# Patient Record
Sex: Male | Born: 1955 | Race: Black or African American | Hispanic: No | Marital: Married | Smoking: Former smoker
Health system: Southern US, Community
[De-identification: ages and names within clinical notes are randomized; demographics above are authoritative.]

## PROBLEM LIST (undated history)

## (undated) DIAGNOSIS — M170 Bilateral primary osteoarthritis of knee: Secondary | ICD-10-CM

## (undated) DIAGNOSIS — Z8719 Personal history of other diseases of the digestive system: Secondary | ICD-10-CM

## (undated) DIAGNOSIS — I219 Acute myocardial infarction, unspecified: Secondary | ICD-10-CM

## (undated) DIAGNOSIS — K759 Inflammatory liver disease, unspecified: Secondary | ICD-10-CM

## (undated) DIAGNOSIS — K219 Gastro-esophageal reflux disease without esophagitis: Secondary | ICD-10-CM

## (undated) DIAGNOSIS — E7800 Pure hypercholesterolemia, unspecified: Secondary | ICD-10-CM

## (undated) DIAGNOSIS — L309 Dermatitis, unspecified: Secondary | ICD-10-CM

## (undated) DIAGNOSIS — K5790 Diverticulosis of intestine, part unspecified, without perforation or abscess without bleeding: Secondary | ICD-10-CM

## (undated) DIAGNOSIS — S060X9A Concussion with loss of consciousness of unspecified duration, initial encounter: Secondary | ICD-10-CM

## (undated) DIAGNOSIS — I25118 Atherosclerotic heart disease of native coronary artery with other forms of angina pectoris: Secondary | ICD-10-CM

## (undated) DIAGNOSIS — C801 Malignant (primary) neoplasm, unspecified: Secondary | ICD-10-CM

## (undated) DIAGNOSIS — N529 Male erectile dysfunction, unspecified: Secondary | ICD-10-CM

## (undated) DIAGNOSIS — N183 Chronic kidney disease, stage 3 (moderate): Secondary | ICD-10-CM

## (undated) DIAGNOSIS — Z9889 Other specified postprocedural states: Secondary | ICD-10-CM

## (undated) DIAGNOSIS — E559 Vitamin D deficiency, unspecified: Secondary | ICD-10-CM

## (undated) DIAGNOSIS — G44309 Post-traumatic headache, unspecified, not intractable: Secondary | ICD-10-CM

## (undated) DIAGNOSIS — S069X9A Unspecified intracranial injury with loss of consciousness of unspecified duration, initial encounter: Secondary | ICD-10-CM

## (undated) DIAGNOSIS — S060XAA Concussion with loss of consciousness status unknown, initial encounter: Secondary | ICD-10-CM

## (undated) DIAGNOSIS — I10 Essential (primary) hypertension: Secondary | ICD-10-CM

## (undated) DIAGNOSIS — R413 Other amnesia: Secondary | ICD-10-CM

## (undated) DIAGNOSIS — I721 Aneurysm of artery of upper extremity: Secondary | ICD-10-CM

## (undated) HISTORY — DX: Atherosclerotic heart disease of native coronary artery with other forms of angina pectoris: I25.118

## (undated) HISTORY — DX: Unspecified intracranial injury with loss of consciousness of unspecified duration, initial encounter: S06.9X9A

## (undated) HISTORY — DX: Bilateral primary osteoarthritis of knee: M17.0

## (undated) HISTORY — DX: Concussion with loss of consciousness status unknown, initial encounter: S06.0XAA

## (undated) HISTORY — DX: Acute myocardial infarction, unspecified: I21.9

## (undated) HISTORY — DX: Other specified postprocedural states: Z98.890

## (undated) HISTORY — DX: Chronic kidney disease, stage 3 (moderate): N18.3

## (undated) HISTORY — DX: Essential (primary) hypertension: I10

## (undated) HISTORY — PX: CARDIAC CATHETERIZATION: SHX172

## (undated) HISTORY — DX: Post-traumatic headache, unspecified, not intractable: G44.309

## (undated) HISTORY — PX: DIAGNOSTIC LAPAROSCOPY: SUR761

## (undated) HISTORY — DX: Concussion with loss of consciousness of unspecified duration, initial encounter: S06.0X9A

## (undated) HISTORY — DX: Gastro-esophageal reflux disease without esophagitis: K21.9

## (undated) HISTORY — DX: Male erectile dysfunction, unspecified: N52.9

## (undated) HISTORY — PX: COLONOSCOPY: SHX174

## (undated) HISTORY — DX: Dermatitis, unspecified: L30.9

## (undated) HISTORY — DX: Other amnesia: R41.3

## (undated) HISTORY — PX: HERNIA REPAIR: SHX51

## (undated) HISTORY — DX: Personal history of other diseases of the digestive system: Z87.19

## (undated) HISTORY — PX: REPLACEMENT UNICONDYLAR JOINT KNEE: SUR1227

---

## 1988-01-18 HISTORY — PX: UMBILICAL HERNIA REPAIR: SHX196

## 2008-11-04 ENCOUNTER — Ambulatory Visit: Payer: Self-pay | Admitting: Gastroenterology

## 2010-04-15 ENCOUNTER — Ambulatory Visit: Payer: Self-pay | Admitting: Cardiology

## 2010-06-10 ENCOUNTER — Ambulatory Visit: Payer: Self-pay | Admitting: Gastroenterology

## 2010-12-15 ENCOUNTER — Encounter: Payer: Self-pay | Admitting: Internal Medicine

## 2010-12-18 ENCOUNTER — Encounter: Payer: Self-pay | Admitting: Internal Medicine

## 2011-01-18 ENCOUNTER — Encounter: Payer: Self-pay | Admitting: Internal Medicine

## 2011-02-22 ENCOUNTER — Ambulatory Visit: Payer: Self-pay | Admitting: Orthopedic Surgery

## 2012-04-04 ENCOUNTER — Encounter: Payer: Self-pay | Admitting: Nurse Practitioner

## 2012-04-04 ENCOUNTER — Ambulatory Visit (INDEPENDENT_AMBULATORY_CARE_PROVIDER_SITE_OTHER): Payer: Managed Care, Other (non HMO) | Admitting: Nurse Practitioner

## 2012-04-04 VITALS — BP 131/79 | HR 77 | Ht 69.5 in | Wt 219.0 lb

## 2012-04-04 DIAGNOSIS — R519 Headache, unspecified: Secondary | ICD-10-CM | POA: Insufficient documentation

## 2012-04-04 DIAGNOSIS — R51 Headache: Secondary | ICD-10-CM

## 2012-04-04 NOTE — Progress Notes (Signed)
I have read the note, and I agree with the medical plans. Marlan Palau MD 04/04/2012 2:43 PM
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
## 2012-04-04 NOTE — Patient Instructions (Signed)
Pt to continue Trazodone for headache.  [facility] regarding [facility]. Your BMI is elevated.  Please bring med list every visit.  Healthy diet for weight loss.

## 2012-07-11 ENCOUNTER — Telehealth: Payer: Self-pay | Admitting: Neurology

## 2012-07-11 NOTE — Telephone Encounter (Signed)
Patient states he went from having 2 headcahes a week to 4-5 a week. Patient takes 800 MG ibuprofen otc with relief , patient is concerned because they are more frequent now. He was last seen on 04-04-2012 for headaches and was not placed on any medication at the time and denies taking  Trazodone for headache.  Please call patient  [phone].

## 2012-07-12 NOTE — Telephone Encounter (Signed)
When patient was seen in 04/04/12 per chart review, he has continued to gain weight and has an elevated BMI.  He told me he was to [facility]. He was having headaches on awakening that is consistent with OSA. The trazadone is for sleep, because better sleep results in better headache control.  Ibuprofen and similar drugs cause rebound headache and problems with the liver.

## 2012-07-12 NOTE — Telephone Encounter (Signed)
I spoke with patient and he is also getting headaches during the day at work, in addition to waking with headaches.  That is when he takes Ibuprofen.  He said he won't be able to get a sleep study with the [place] for while.  He would like to know what else he can do?

## 2012-07-13 MED ORDER — TOPIRAMATE 25 MG PO TABS: ORAL_TABLET | ORAL | Status: DC

## 2012-07-13 NOTE — Addendum Note (Signed)
Addended by: Beverely Low on: 07/13/2012 12:43 PM   Modules accepted: Orders
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
## 2012-12-06 ENCOUNTER — Other Ambulatory Visit: Payer: Self-pay | Admitting: Nurse Practitioner

## 2013-01-31 ENCOUNTER — Ambulatory Visit: Payer: Self-pay | Admitting: Unknown Physician Specialty

## 2013-06-28 DIAGNOSIS — E7800 Pure hypercholesterolemia, unspecified: Secondary | ICD-10-CM | POA: Insufficient documentation

## 2013-06-28 DIAGNOSIS — I10 Essential (primary) hypertension: Secondary | ICD-10-CM | POA: Insufficient documentation

## 2013-06-28 DIAGNOSIS — N529 Male erectile dysfunction, unspecified: Secondary | ICD-10-CM

## 2013-06-28 DIAGNOSIS — K219 Gastro-esophageal reflux disease without esophagitis: Secondary | ICD-10-CM

## 2013-06-28 HISTORY — DX: Male erectile dysfunction, unspecified: N52.9

## 2013-06-28 HISTORY — DX: Gastro-esophageal reflux disease without esophagitis: K21.9

## 2013-06-29 DIAGNOSIS — I25118 Atherosclerotic heart disease of native coronary artery with other forms of angina pectoris: Secondary | ICD-10-CM

## 2013-06-29 HISTORY — DX: Atherosclerotic heart disease of native coronary artery with other forms of angina pectoris: I25.118

## 2014-01-17 HISTORY — PX: KNEE CARTILAGE SURGERY: SHX688

## 2014-07-02 DIAGNOSIS — L309 Dermatitis, unspecified: Secondary | ICD-10-CM

## 2014-07-02 DIAGNOSIS — M170 Bilateral primary osteoarthritis of knee: Secondary | ICD-10-CM

## 2014-07-02 HISTORY — DX: Dermatitis, unspecified: L30.9

## 2014-07-02 HISTORY — DX: Bilateral primary osteoarthritis of knee: M17.0

## 2014-07-03 DIAGNOSIS — Z79899 Other long term (current) drug therapy: Secondary | ICD-10-CM | POA: Insufficient documentation

## 2014-08-14 ENCOUNTER — Ambulatory Visit (INDEPENDENT_AMBULATORY_CARE_PROVIDER_SITE_OTHER): Payer: Managed Care, Other (non HMO) | Admitting: Neurology

## 2014-08-14 ENCOUNTER — Encounter: Payer: Self-pay | Admitting: Neurology

## 2014-08-14 VITALS — BP 136/102 | HR 64 | Ht 69.5 in | Wt 228.2 lb

## 2014-08-14 DIAGNOSIS — R413 Other amnesia: Secondary | ICD-10-CM

## 2014-08-14 DIAGNOSIS — G441 Vascular headache, not elsewhere classified: Secondary | ICD-10-CM | POA: Diagnosis not present

## 2014-08-14 HISTORY — DX: Other amnesia: R41.3
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
## 2014-09-17 ENCOUNTER — Other Ambulatory Visit: Payer: Self-pay | Admitting: Unknown Physician Specialty

## 2014-09-17 ENCOUNTER — Other Ambulatory Visit: Payer: Self-pay

## 2014-09-17 DIAGNOSIS — Z139 Encounter for screening, unspecified: Secondary | ICD-10-CM

## 2014-09-17 DIAGNOSIS — M170 Bilateral primary osteoarthritis of knee: Secondary | ICD-10-CM

## 2014-09-24 ENCOUNTER — Ambulatory Visit: Admission: RE | Admit: 2014-09-24 | Payer: PRIVATE HEALTH INSURANCE | Source: Ambulatory Visit

## 2014-09-24 ENCOUNTER — Ambulatory Visit: Payer: PRIVATE HEALTH INSURANCE

## 2014-10-07 ENCOUNTER — Ambulatory Visit
Admission: RE | Admit: 2014-10-07 | Discharge: 2014-10-07 | Disposition: A | Discharge status: Home / Self Care | Payer: PRIVATE HEALTH INSURANCE | Source: Ambulatory Visit | Attending: Unknown Physician Specialty | Admitting: Unknown Physician Specialty

## 2014-10-07 ENCOUNTER — Ambulatory Visit: Payer: PRIVATE HEALTH INSURANCE

## 2014-10-07 DIAGNOSIS — Z01818 Encounter for other preprocedural examination: Secondary | ICD-10-CM | POA: Diagnosis present

## 2014-10-07 DIAGNOSIS — M170 Bilateral primary osteoarthritis of knee: Secondary | ICD-10-CM | POA: Diagnosis present

## 2014-10-07 DIAGNOSIS — Z139 Encounter for screening, unspecified: Secondary | ICD-10-CM

## 2014-10-30 DIAGNOSIS — Z96653 Presence of artificial knee joint, bilateral: Secondary | ICD-10-CM | POA: Insufficient documentation

## 2014-11-03 ENCOUNTER — Ambulatory Visit: Payer: PRIVATE HEALTH INSURANCE | Admitting: Psychology

## 2014-11-04 ENCOUNTER — Encounter: Payer: Self-pay | Admitting: Psychology

## 2014-11-25 ENCOUNTER — Ambulatory Visit: Payer: PRIVATE HEALTH INSURANCE | Attending: Psychology | Admitting: Psychology

## 2014-11-25 DIAGNOSIS — R413 Other amnesia: Secondary | ICD-10-CM | POA: Diagnosis not present
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
## 2014-12-04 ENCOUNTER — Ambulatory Visit (INDEPENDENT_AMBULATORY_CARE_PROVIDER_SITE_OTHER): Payer: PRIVATE HEALTH INSURANCE | Admitting: Psychology

## 2014-12-04 DIAGNOSIS — S069X5D Unspecified intracranial injury with loss of consciousness greater than 24 hours with return to pre-existing conscious level, subsequent encounter: Secondary | ICD-10-CM

## 2014-12-04 DIAGNOSIS — G3184 Mild cognitive impairment, so stated: Secondary | ICD-10-CM
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
## 2014-12-22 ENCOUNTER — Telehealth: Payer: Self-pay | Admitting: Neurology

## 2014-12-22 MED ORDER — SERTRALINE HCL 50 MG PO TABS: 50.0000 mg | ORAL_TABLET | Freq: Every day | ORAL | Status: DC
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
## 2014-12-24 NOTE — Telephone Encounter (Signed)
Appointment scheduled with Hoyle Sauer, NP 02/25/15.

## 2015-01-05 ENCOUNTER — Telehealth: Payer: Self-pay | Admitting: Neurology

## 2015-01-05 NOTE — Telephone Encounter (Signed)
LFt vm for Daniel Jackson to call back about video disposition at [phone] with Dr. Jannifer Franklin

## 2015-01-05 NOTE — Telephone Encounter (Signed)
We are trying to get the physician set up for the afternoon of January 9.

## 2015-01-05 NOTE — Telephone Encounter (Signed)
Cindy/Mark Gray's office returned Jackson call regarding video deposition with Dr. Jannifer Franklin.

## 2015-01-06 NOTE — Telephone Encounter (Signed)
I called Daniel Jackson. Video deposition scheduled for 01/26/15 at 4 PM. They are going to send payment of $350 prior to appointment and will bring a check incase the appointment runs over their estimated 1 hour.

## 2015-02-13 DIAGNOSIS — N183 Chronic kidney disease, stage 3 unspecified: Secondary | ICD-10-CM

## 2015-02-13 DIAGNOSIS — D631 Anemia in chronic kidney disease: Secondary | ICD-10-CM | POA: Insufficient documentation

## 2015-02-13 DIAGNOSIS — S069XAA Unspecified intracranial injury with loss of consciousness status unknown, initial encounter: Secondary | ICD-10-CM

## 2015-02-13 DIAGNOSIS — S069X9A Unspecified intracranial injury with loss of consciousness of unspecified duration, initial encounter: Secondary | ICD-10-CM

## 2015-02-13 HISTORY — DX: Chronic kidney disease, stage 3 unspecified: N18.30

## 2015-02-13 HISTORY — DX: Unspecified intracranial injury with loss of consciousness of unspecified duration, initial encounter: S06.9X9A

## 2015-02-13 HISTORY — DX: Unspecified intracranial injury with loss of consciousness status unknown, initial encounter: S06.9XAA

## 2015-02-25 ENCOUNTER — Telehealth: Payer: Self-pay | Admitting: Nurse Practitioner

## 2015-02-25 ENCOUNTER — Ambulatory Visit: Payer: Self-pay | Admitting: Nurse Practitioner

## 2015-02-25 ENCOUNTER — Ambulatory Visit (INDEPENDENT_AMBULATORY_CARE_PROVIDER_SITE_OTHER): Payer: PRIVATE HEALTH INSURANCE | Admitting: Nurse Practitioner

## 2015-02-25 ENCOUNTER — Encounter: Payer: Self-pay | Admitting: Nurse Practitioner

## 2015-02-25 VITALS — BP 140/80 | HR 58 | Wt 203.0 lb

## 2015-02-25 DIAGNOSIS — R413 Other amnesia: Secondary | ICD-10-CM

## 2015-02-25 DIAGNOSIS — G441 Vascular headache, not elsewhere classified: Secondary | ICD-10-CM | POA: Diagnosis not present

## 2015-02-25 NOTE — Telephone Encounter (Signed)
Pt told me pharmacy is [facility] [address] [place] the only [employer] listed in [place] is [facility] and [employer]. Please call back to clarify

## 2015-02-25 NOTE — Patient Instructions (Signed)
Continue Zoloft at current dose F/U in 8 months
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
## 2015-02-25 NOTE — Progress Notes (Signed)
I have read the note, and I agree with the clinical assessment and plan.  Erikson Danzy KEITH   

## 2015-02-26 NOTE — Telephone Encounter (Signed)
Called patient again and left a messsage I need to clarify the pharmacy for his refills.

## 2015-03-03 NOTE — Telephone Encounter (Signed)
Patient has been called again to clarify his pharmacy left  voice message. I will close the phone note.

## 2015-03-05 ENCOUNTER — Other Ambulatory Visit: Payer: Self-pay | Admitting: Nurse Practitioner

## 2015-03-05 MED ORDER — SERTRALINE HCL 50 MG PO TABS: 50.0000 mg | ORAL_TABLET | Freq: Every day | ORAL | Status: DC

## 2015-03-05 NOTE — Telephone Encounter (Signed)
Pt called said [facility] in [place] is [facility] [address]

## 2015-03-08 ENCOUNTER — Other Ambulatory Visit: Payer: Self-pay | Admitting: Neurology

## 2015-06-05 ENCOUNTER — Other Ambulatory Visit: Payer: Self-pay | Admitting: Neurology

## 2015-10-13 ENCOUNTER — Other Ambulatory Visit: Payer: Self-pay | Admitting: Neurology

## 2015-10-27 ENCOUNTER — Ambulatory Visit: Payer: PRIVATE HEALTH INSURANCE | Admitting: Nurse Practitioner

## 2016-01-02 DIAGNOSIS — Z8546 Personal history of malignant neoplasm of prostate: Secondary | ICD-10-CM | POA: Insufficient documentation

## 2016-01-02 DIAGNOSIS — R972 Elevated prostate specific antigen [PSA]: Secondary | ICD-10-CM | POA: Insufficient documentation

## 2016-01-05 DIAGNOSIS — Z96652 Presence of left artificial knee joint: Secondary | ICD-10-CM | POA: Insufficient documentation

## 2016-01-05 DIAGNOSIS — Z96651 Presence of right artificial knee joint: Secondary | ICD-10-CM | POA: Insufficient documentation

## 2016-02-08 ENCOUNTER — Ambulatory Visit: Payer: Self-pay

## 2016-02-08 ENCOUNTER — Other Ambulatory Visit: Payer: Self-pay | Admitting: Neurology

## 2016-02-09 ENCOUNTER — Ambulatory Visit: Payer: Self-pay

## 2016-03-01 ENCOUNTER — Ambulatory Visit (INDEPENDENT_AMBULATORY_CARE_PROVIDER_SITE_OTHER): Payer: PRIVATE HEALTH INSURANCE | Admitting: Urology

## 2016-03-01 ENCOUNTER — Encounter: Payer: Self-pay | Admitting: Urology

## 2016-03-01 VITALS — BP 120/79 | HR 72 | Ht 69.0 in | Wt 229.0 lb

## 2016-03-01 DIAGNOSIS — R972 Elevated prostate specific antigen [PSA]: Secondary | ICD-10-CM
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
## 2016-03-02 LAB — PSA TOTAL (REFLEX TO FREE): Prostate Specific Ag, Serum: 8.8 ng/mL — ABNORMAL HIGH (ref 0.0–4.0)

## 2016-03-02 LAB — FPSA% REFLEX
% FREE PSA: 14.1 %
PSA, FREE: 1.24 ng/mL

## 2016-03-04 ENCOUNTER — Telehealth: Payer: Self-pay

## 2016-03-04 NOTE — Telephone Encounter (Signed)
Daniel Aloe, MD  Lestine Box, LPN        Notify patient his PSA remains elevated -- I recommend we proceed with a prostate biopsy as discussed. Please schedule for a prostate biopsy.    LMOM

## 2016-03-07 ENCOUNTER — Telehealth: Payer: Self-pay

## 2016-03-07 NOTE — Telephone Encounter (Signed)
Patient was scheduled for a prostate biopsy with Angie and transferred to go over pre-instructions.  Spoke with patient and he states that his ASA 81mg  is only taken as preventative and not under cardiac direction. Patient was told to stop ASA 81mg  7 days prior to procedure, prostate biopsy instructions were given in detail and patient verbalized understanding. A copy of written instructions with apt date and time was also mailed to the patient/

## 2016-03-07 NOTE — Telephone Encounter (Signed)
Patient notified and will schedule Biopsy

## 2016-03-07 NOTE — Telephone Encounter (Signed)
LMOM

## 2016-03-22 ENCOUNTER — Encounter: Payer: Self-pay | Admitting: Urology

## 2016-03-22 ENCOUNTER — Other Ambulatory Visit: Payer: Self-pay | Admitting: Urology

## 2016-03-22 ENCOUNTER — Ambulatory Visit (INDEPENDENT_AMBULATORY_CARE_PROVIDER_SITE_OTHER): Payer: PRIVATE HEALTH INSURANCE | Admitting: Urology

## 2016-03-22 VITALS — BP 146/81 | HR 65 | Ht 69.0 in | Wt 233.6 lb

## 2016-03-22 DIAGNOSIS — R972 Elevated prostate specific antigen [PSA]: Secondary | ICD-10-CM | POA: Diagnosis not present

## 2016-03-22 MED ORDER — GENTAMICIN SULFATE 40 MG/ML IJ SOLN
80.0000 mg | Freq: Once | INTRAMUSCULAR | Status: AC
  Administered 2016-03-22: 80 mg via INTRAMUSCULAR

## 2016-03-22 MED ORDER — LEVOFLOXACIN 500 MG PO TABS
500.0000 mg | ORAL_TABLET | Freq: Once | ORAL | Status: AC
  Administered 2016-03-22: 500 mg via ORAL

## 2016-03-22 NOTE — Progress Notes (Signed)
Pt returns for prostate biopsy.   PSA of 9.07 on 01/01/2016. UA was negative. He has no prior PSA elevation or prostate biopsy. His Feb 2018 DRE c/w L>R asymmetry. Repeat PSA 8.8, 14% free.   No h/o BPH. He has occasional frequency and nocturia. He has no family history of prostate cancer.  He has a h/o ED.   Prostate Biopsy Procedure   Informed consent was obtained after discussing risks/benefits of the procedure.  A time out was performed to ensure correct patient identity.  Pre-Procedure: - Last PSA Level: No results found for: PSA - Gentamicin given prophylactically - Levaquin 500 mg administered PO -DRE: Subtle induration bilaterally - no obvious nodule -Transrectal Ultrasound performed revealing a 52.23 gram prostate -No median lobe noted -Hypoechoic area left mid about 10 mm   Procedure: - Prostate block performed using 10 cc 1% lidocaine and biopsies taken from sextant areas, a total of 12 under ultrasound guidance.  Post-Procedure: - Patient tolerated the procedure well - He was counseled to seek immediate medical attention if experiences any severe pain, significant bleeding, or fevers - Return in one week to discuss biopsy results

## 2016-03-29 ENCOUNTER — Other Ambulatory Visit: Payer: Self-pay | Admitting: Urology

## 2016-03-29 LAB — PATHOLOGY REPORT

## 2016-04-01 ENCOUNTER — Encounter: Payer: Self-pay | Admitting: Urology

## 2016-04-01 ENCOUNTER — Ambulatory Visit (INDEPENDENT_AMBULATORY_CARE_PROVIDER_SITE_OTHER): Payer: PRIVATE HEALTH INSURANCE | Admitting: Urology

## 2016-04-01 VITALS — BP 142/93 | HR 52 | Ht 69.0 in | Wt 229.4 lb

## 2016-04-01 DIAGNOSIS — C61 Malignant neoplasm of prostate: Secondary | ICD-10-CM | POA: Diagnosis not present

## 2016-04-01 DIAGNOSIS — N5201 Erectile dysfunction due to arterial insufficiency: Secondary | ICD-10-CM | POA: Diagnosis not present
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
## 2016-04-07 ENCOUNTER — Encounter (INDEPENDENT_AMBULATORY_CARE_PROVIDER_SITE_OTHER): Payer: Self-pay

## 2016-04-07 ENCOUNTER — Ambulatory Visit
Admission: RE | Admit: 2016-04-07 | Discharge: 2016-04-07 | Disposition: A | Discharge status: Home / Self Care | Payer: PRIVATE HEALTH INSURANCE | Source: Ambulatory Visit | Attending: Radiation Oncology | Admitting: Radiation Oncology

## 2016-04-07 ENCOUNTER — Encounter: Payer: Self-pay | Admitting: Radiation Oncology

## 2016-04-07 VITALS — BP 133/77 | HR 64 | Temp 97.1°F | Resp 18 | Wt 229.3 lb

## 2016-04-07 DIAGNOSIS — C61 Malignant neoplasm of prostate: Secondary | ICD-10-CM
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
## 2016-04-07 NOTE — Progress Notes (Signed)
  Oncology Nurse Navigator Documentation Met with Daniel Jackson, his spouse and mother in law during consult with Dr. Baruch Gouty. Introduced nurse navigator services and provided contact information for future needs. He will return to [facility] 4/19 to discuss surgery further. Navigator Location: [facility] (04/07/16 1400)   )Navigator Encounter Type: Initial RadOnc (04/07/16 1400)                     Patient Visit Type: RadOnc (04/07/16 1400) Treatment Phase: Pre-Tx/Tx Discussion (04/07/16 1400) Barriers/Navigation Needs: No barriers at this time (04/07/16 1400)                Acuity: Level 2 (04/07/16 1400)   Acuity Level 2: Initial guidance, education and coordination as needed;Educational needs;Ongoing guidance and education throughout treatment as needed (04/07/16 1400)     Time Spent with Patient: 30 (04/07/16 1400)

## 2016-05-05 ENCOUNTER — Ambulatory Visit (INDEPENDENT_AMBULATORY_CARE_PROVIDER_SITE_OTHER): Payer: PRIVATE HEALTH INSURANCE | Admitting: Urology

## 2016-05-05 ENCOUNTER — Encounter: Payer: Self-pay | Admitting: Urology

## 2016-05-05 VITALS — BP 126/91 | HR 67 | Ht 69.0 in | Wt 228.0 lb

## 2016-05-05 DIAGNOSIS — N529 Male erectile dysfunction, unspecified: Secondary | ICD-10-CM | POA: Diagnosis not present

## 2016-05-05 DIAGNOSIS — C61 Malignant neoplasm of prostate: Secondary | ICD-10-CM | POA: Diagnosis not present
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
## 2016-05-12 ENCOUNTER — Other Ambulatory Visit: Payer: Self-pay | Admitting: Radiology

## 2016-05-12 ENCOUNTER — Telehealth: Payer: Self-pay | Admitting: Radiology

## 2016-05-12 ENCOUNTER — Ambulatory Visit: Payer: PRIVATE HEALTH INSURANCE | Admitting: Radiation Oncology

## 2016-05-12 DIAGNOSIS — C61 Malignant neoplasm of prostate: Secondary | ICD-10-CM

## 2016-05-12 NOTE — Telephone Encounter (Signed)
Notified pt's wife of surgery scheduled with Dr [name] on 07/01/16, pre-admit testing appt & to call day prior to surgery for arrival time to SDS. Advised that pt will be notified when to hold ASA 81mg  once clearance is obtained from Dr Josefa Half. Wife had no questions & voices understanding.

## 2016-05-13 NOTE — Telephone Encounter (Signed)
Advised pt per clearance obtained from Dr Saralyn Pilar to hold ASA 81mg  x 7 days prior to surgery, beginning on 06/24/16. Pt voices understanding.

## 2016-05-29 ENCOUNTER — Other Ambulatory Visit: Payer: Self-pay | Admitting: Neurology

## 2016-05-31 ENCOUNTER — Ambulatory Visit: Payer: PRIVATE HEALTH INSURANCE

## 2016-06-02 ENCOUNTER — Encounter: Payer: Self-pay | Admitting: Neurology

## 2016-06-02 ENCOUNTER — Ambulatory Visit (INDEPENDENT_AMBULATORY_CARE_PROVIDER_SITE_OTHER): Payer: 59 | Admitting: Neurology

## 2016-06-02 VITALS — BP 139/93 | HR 57 | Ht 69.0 in | Wt 232.0 lb

## 2016-06-02 DIAGNOSIS — S069X1S Unspecified intracranial injury with loss of consciousness of 30 minutes or less, sequela: Secondary | ICD-10-CM | POA: Diagnosis not present

## 2016-06-02 MED ORDER — SERTRALINE HCL 100 MG PO TABS: 100.0000 mg | ORAL_TABLET | Freq: Every day | ORAL | 11 refills | Status: DC

## 2016-06-02 NOTE — Patient Instructions (Signed)
   We will be increasing the zoloft to 100 mg daily.
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
## 2016-06-04 ENCOUNTER — Other Ambulatory Visit: Payer: Self-pay | Admitting: Neurology

## 2016-06-15 ENCOUNTER — Ambulatory Visit: Payer: PRIVATE HEALTH INSURANCE | Admitting: Physical Therapy

## 2016-06-16 IMAGING — CT CT KNEE*R* W/O CM
2 of 16 series · 4 of 33 positions shown, 5 images · non-contrast
Comparison: None.

CLINICAL DATA: Severe osteoarthritis of the knee. Preoperative
planning for MAKOplasty.

EXAM:
CT OF THE right KNEE WITHOUT CONTRAST
TECHNIQUE: Multidetector CT imaging of the right knee was performed according
to the VELTRAN protocol. Multiplanar CT image reconstructions were also
generated. Axial images were also obtained through the right hip and
right ankle for preoperative planning.

[Series 6: axial bone right · axial · 0.44mm/px · z∈[+244,+572]mm · 2 of 165 slices shown, 3 images]
[im 1/165  soft-tissue]
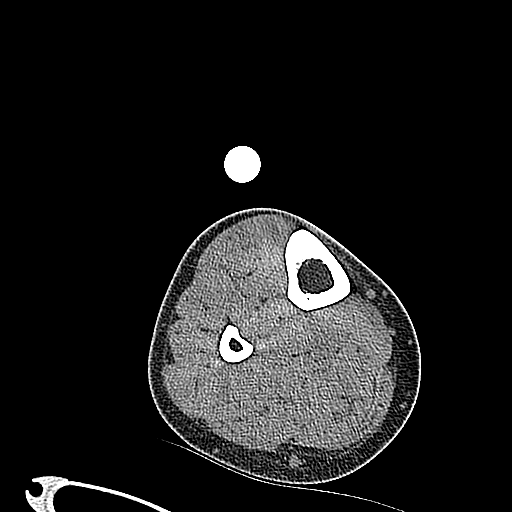
[im 1/165  bone]
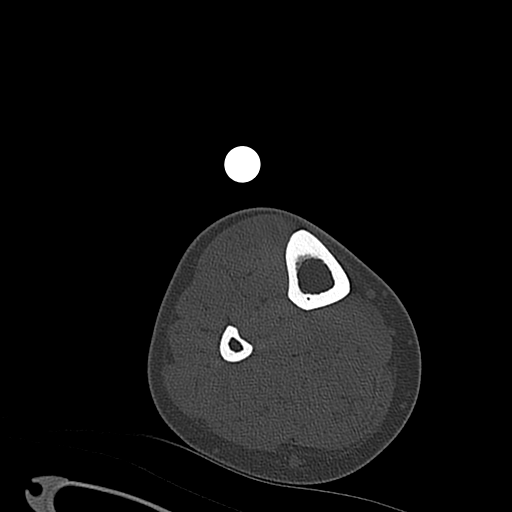
[im 165/165  bone]
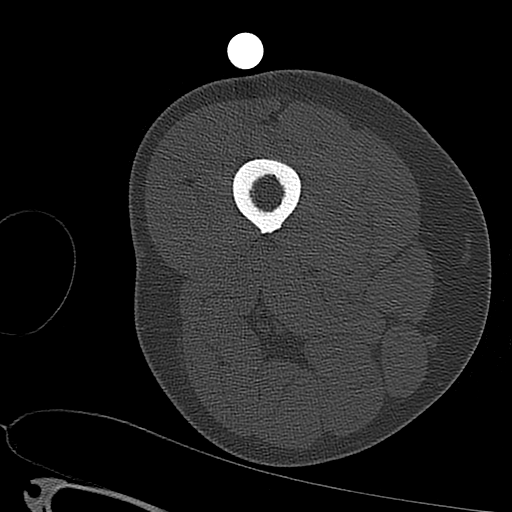

[Series 603: sagittal bone right · sagittal · 0.64mm/px · 2 of 59 slices shown]
[im 20/59  bone]
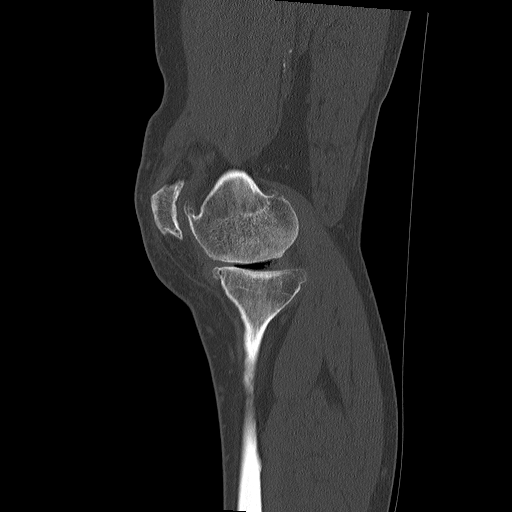
[im 39/59  bone]
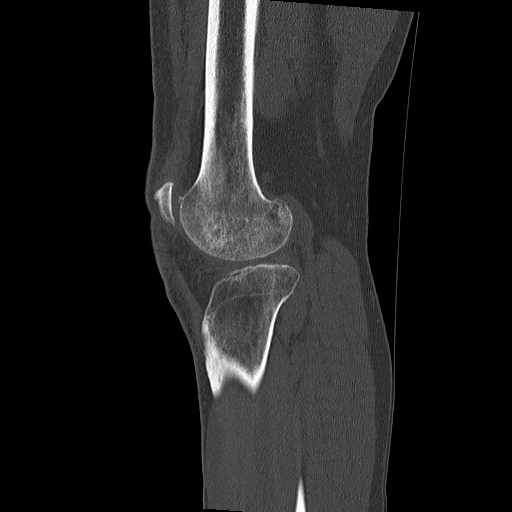

[4 of 33 positions shown; findings below may reference images not displayed]

FINDINGS: There is severe osteoarthritis of the medial compartment and of the
patellofemoral compartment. Slight degenerative changes of the
lateral compartment. Prominent tricompartmental marginal
osteophytes. Vacuum phenomenon in the medial compartment.

Minimal subcortical cysts in the acetabulum of the right hip.
IMPRESSION: Severe osteoarthritis of the patellofemoral and medial compartments
of the right knee.

## 2016-06-17 MED ORDER — CEFAZOLIN SODIUM-DEXTROSE 2-4 GM/100ML-% IV SOLN
INTRAVENOUS | Status: AC
  Filled 2016-06-17: qty 100

## 2016-06-17 MED ORDER — FAMOTIDINE 20 MG PO TABS
ORAL_TABLET | ORAL | Status: AC
  Filled 2016-06-17: qty 1

## 2016-06-21 ENCOUNTER — Inpatient Hospital Stay: Admission: RE | Admit: 2016-06-21 | Payer: PRIVATE HEALTH INSURANCE | Source: Ambulatory Visit

## 2016-06-22 ENCOUNTER — Other Ambulatory Visit: Payer: PRIVATE HEALTH INSURANCE

## 2016-06-23 ENCOUNTER — Ambulatory Visit: Payer: 59 | Attending: Urology | Admitting: Physical Therapy

## 2016-06-23 ENCOUNTER — Other Ambulatory Visit: Payer: PRIVATE HEALTH INSURANCE

## 2016-06-23 ENCOUNTER — Encounter
Admission: RE | Admit: 2016-06-23 | Discharge: 2016-06-23 | Disposition: A | Discharge status: Home / Self Care | Payer: 59 | Source: Ambulatory Visit | Attending: Urology | Admitting: Urology

## 2016-06-23 ENCOUNTER — Telehealth: Payer: Self-pay | Admitting: Radiology

## 2016-06-23 DIAGNOSIS — Z01812 Encounter for preprocedural laboratory examination: Secondary | ICD-10-CM | POA: Insufficient documentation

## 2016-06-23 DIAGNOSIS — I10 Essential (primary) hypertension: Secondary | ICD-10-CM | POA: Insufficient documentation

## 2016-06-23 DIAGNOSIS — Z01810 Encounter for preprocedural cardiovascular examination: Secondary | ICD-10-CM | POA: Diagnosis present

## 2016-06-23 DIAGNOSIS — R278 Other lack of coordination: Secondary | ICD-10-CM | POA: Diagnosis present

## 2016-06-23 DIAGNOSIS — R9431 Abnormal electrocardiogram [ECG] [EKG]: Secondary | ICD-10-CM | POA: Insufficient documentation

## 2016-06-23 DIAGNOSIS — M6281 Muscle weakness (generalized): Secondary | ICD-10-CM | POA: Diagnosis present

## 2016-06-23 HISTORY — DX: Inflammatory liver disease, unspecified: K75.9

## 2016-06-23 LAB — CBC
HEMATOCRIT: 38.8 % — AB (ref 40.0–52.0)
Hemoglobin: 13.1 g/dL (ref 13.0–18.0)
MCH: 29.8 pg (ref 26.0–34.0)
MCHC: 33.9 g/dL (ref 32.0–36.0)
MCV: 88.1 fL (ref 80.0–100.0)
PLATELETS: 226 10*3/uL (ref 150–440)
RBC: 4.4 MIL/uL (ref 4.40–5.90)
RDW: 13.4 % (ref 11.5–14.5)
WBC: 6.4 10*3/uL (ref 3.8–10.6)

## 2016-06-23 LAB — BASIC METABOLIC PANEL
Anion gap: 7 (ref 5–15)
BUN: 27 mg/dL — ABNORMAL HIGH (ref 6–20)
CHLORIDE: 107 mmol/L (ref 101–111)
CO2: 23 mmol/L (ref 22–32)
CREATININE: 1.76 mg/dL — AB (ref 0.61–1.24)
Calcium: 8.9 mg/dL (ref 8.9–10.3)
GFR calc non Af Amer: 40 mL/min — ABNORMAL LOW (ref >60)
GFR, EST AFRICAN AMERICAN: 46 mL/min — AB (ref >60)
Glucose, Bld: 140 mg/dL — ABNORMAL HIGH (ref 65–99)
POTASSIUM: 3.7 mmol/L (ref 3.5–5.1)
Sodium: 137 mmol/L (ref 135–145)

## 2016-06-23 LAB — APTT: aPTT: 27 seconds (ref 24–36)

## 2016-06-23 LAB — PROTIME-INR
INR: 0.98
Prothrombin Time: 13 seconds (ref 11.4–15.2)
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
## 2016-06-23 NOTE — Pre-Procedure Instructions (Signed)
Cardiac clearance by Dr Saralyn Pilar on chart.

## 2016-06-23 NOTE — Pre-Procedure Instructions (Signed)
Dr Raechel Ache' office notified regarding patient's feelings of depression and hopelessness. Also informed that patient was attempting to get assistance through the [place].

## 2016-06-23 NOTE — Telephone Encounter (Signed)
-----   Message from Florentina Addison, RN sent at 06/23/2016  3:22 PM EDT ----- Regarding: Feelings of hopelessness  Patient expressed feelings of hopelessness.  Flat affect.  Has had feeling to self harm but not at present.  Is trying to get an appointment to get help with feeling of depression through the [place].

## 2016-06-23 NOTE — Patient Instructions (Addendum)
Your procedure is scheduled on: 07/01/16 Fri Report to Same Day Surgery 2nd floor medical mall [facility] Entrance-take elevator on left to 2nd floor.  Check in with surgery information desk.) To find out your arrival time please call [phone] between 1PM - 3PM on 06/30/16 Thurs  Remember: Instructions that are not followed completely may result in serious medical risk, up to and including death, or upon the discretion of your surgeon and anesthesiologist your surgery may need to be rescheduled.    _x___ 1. Do not eat food or drink liquids after midnight. No gum chewing or                              hard candies.     __x__ 2. No Alcohol for 24 hours before or after surgery.   __x__3. No Smoking for 24 prior to surgery.   ____  4. Bring all medications with you on the day of surgery if instructed.    __x__ 5. Notify your doctor if there is any change in your medical condition     (cold, fever, infections).     Do not wear jewelry, make-up, hairpins, clips or nail polish.  Do not wear lotions, powders, or perfumes. You may wear deodorant.  Do not shave 48 hours prior to surgery. Men may shave face and neck.  Do not bring valuables to the hospital.    [facility] is not responsible for any belongings or valuables.               Contacts, dentures or bridgework may not be worn into surgery.  Leave your suitcase in the car. After surgery it may be brought to your room.  For patients admitted to the hospital, discharge time is determined by your                       treatment team.   Patients discharged the day of surgery will not be allowed to drive home.  You will need someone to drive you home and stay with you the night of your procedure.    Please read over the following fact sheets that you were given:   [facility] Preparing for Surgery and or MRSA Information   _x___ Take anti-hypertensive (unless it includes a diuretic), cardiac, seizure, asthma,     anti-reflux and  psychiatric medicines. These include:  1. atorvastatin (LIPITOR)   2.ezetimibe (ZETIA  3.omeprazole (PRILOSEC OTC)  4. sertraline (ZOLOFT)   5.  6.  ____Fleets enema or Magnesium Citrate as directed.   _x___ Use CHG Soap or sage wipes as directed on instruction sheet   ____ Use inhalers on the day of surgery and bring to hospital day of surgery  ____ Stop Metformin and Janumet 2 days prior to surgery.    ____ Take 1/2 of usual insulin dose the night before surgery and none on the morning     surgery.   _x___ Follow recommendations from Cardiologist, Pulmonologist or PCP regarding          stopping Aspirin, Coumadin, Pllavix ,Eliquis, Effient, or Pradaxa, and Pletal.                   To stop Aspirin on Saturday.  X____Stop Anti-inflammatories such as Advil, Aleve, Ibuprofen, Motrin, Naproxen, Naprosyn, Goodies powders or aspirin products. OK to take Tylenol and  Celebrex.   _x___ Stop supplements until after surgery.  But may continue Vitamin D, Vitamin B,       and multivitamin.   ____ Bring C-Pap to the hospital.

## 2016-06-23 NOTE — Patient Instructions (Addendum)
Preserve the function of your pelvic floor, abdomen, and back.              Avoid decreased straining of abdominal/pelvic floor muscles with less  slouching,  holding your breath with lifting/bowel movements)  Stay mindful of your posture throughout the day. Keep your deep core muscles active.  . When sitting: Feet under knees, bear weight through shin bones and pelvic sitting bones  . Alternate between sitting and standing w/ computer work. Screen of computer needs to be at eye level. Desk height needs to be at a level where your shoulders/elbows should be relaxed and not raised.  . When sitting on toilet: prop your feet up on a stepstool. Breathe for bowel movements and do not push/bear down with belly or pelvic floor muscles.  . When lifting: Do not bend forward with your back. Instead stand with feet wide and use squat position to lift with the strength of your legs. Exhale as you lift and keep object close to your center as you move it.  . When getting out of bed, do not do a sit-up. Raise arm towards ear (same side as the edge of bed you will get out of), roll towards that side to be in full sideyling position. Then drop legs off edge of bed while pushing up with        bottom arm.   PELVIC FLOOR / KEGEL EXERCISES   Pelvic floor/ Kegel exercises are used to strengthen the muscles in the base of your pelvis that are responsible for supporting your pelvic organs and preventing urine/feces leakage. Based on your therapist's recommendations, they can be performed while standing, sitting, or lying down.  Make yourself aware of this muscle group by using these cues:  Imagine you are in a crowded room and you feel the need to pass gas. Your response is to pull up and in at the rectum.  Close the rectum. Pull the muscles up inside your body,feeling your scrotum lifting as well . Feel the pelvic floor muscles lift as if you were walking into a cold  lake.  Place your hand on top of your pubic bone. Tighten and draw in the muscles around the anal muscles without squeezing the buttock   Breath holding: If you are holding your breath, you may be bearing down against your bladder instead of pulling it up. If you belly bulges up while you are squeezing, you are holding your breath. Be sure to breathe gently in and out while exercising. Counting out loud may help you avoid holding your breath.  Accessory muscle use: You should not see or feel other muscle movement when performing pelvic floor exercises. When done properly, no one can tell that you are performing the exercises. Keep the buttocks, belly and inner thighs relaxed.  Overdoing it: Your muscles can fatigue and stop working for you if you over-exercise. You may actually leak more or feel soreness at the lower abdomen or rectum.  YOUR HOME EXERCISE PROGRAM   SHORT HOLDS: Position: on back or sitting    Inhale and then exhale. Then squeeze the muscle.  (Be sure to let belly sink in with exhales and not push outward)  Perform 5 repetitions, 5  Times/day  **ALSO SQUEEZE BEFORE YOUR SNEEZE, COUGH, LAUGH to decrease downward pressure   **ALSO EXHALE BEFORE YOU RISE AGAINST GRAVITY (lifting, sit to stand, from squat to stand)    **  .perform before surgery and only after catheter has been removed after surgery   ________________           -   _____________  Stretch out your spine in all 6 directions   Side bend 5 reps  Arm swings  By a table/counter: folded forward and lengthening spine    _____________

## 2016-06-23 NOTE — Pre-Procedure Instructions (Addendum)
Depressed had thoughts of harming self.  Is making an appointment through the [facility] for psychological assistance.  Call Amy at [place].

## 2016-06-23 NOTE — Telephone Encounter (Signed)
Reached out to pt regarding feelings of hopelessness. Pt states he is actually feeling ok. When asked if he wants to postpone surgery he stated "No, we are going to do this.  The sooner the better." He plans to make an appt tomorrow through the [place] with a psychiatrist. Encouraged pt to call back with any concerns or if he just needs someone to talk to about the surgery. Pt voices appreciation & understanding.

## 2016-06-25 LAB — URINE CULTURE: Culture: NO GROWTH

## 2016-07-01 ENCOUNTER — Encounter: Payer: Self-pay | Admitting: *Deleted

## 2016-07-01 ENCOUNTER — Telehealth: Payer: Self-pay | Admitting: Urology

## 2016-07-01 ENCOUNTER — Encounter
Admission: RE | Disposition: A | Discharge status: Home / Self Care | Payer: Self-pay | Source: Ambulatory Visit | Attending: Urology

## 2016-07-01 ENCOUNTER — Inpatient Hospital Stay
Admission: RE | Admit: 2016-07-01 | Discharge: 2016-07-03 | DRG: 707 | Disposition: A | Discharge status: Home / Self Care | Payer: 59 | Source: Ambulatory Visit | Attending: Urology | Admitting: Urology

## 2016-07-01 ENCOUNTER — Inpatient Hospital Stay: Payer: 59 | Admitting: Anesthesiology

## 2016-07-01 DIAGNOSIS — I481 Persistent atrial fibrillation: Secondary | ICD-10-CM | POA: Diagnosis present

## 2016-07-01 DIAGNOSIS — Z7982 Long term (current) use of aspirin: Secondary | ICD-10-CM

## 2016-07-01 DIAGNOSIS — I2510 Atherosclerotic heart disease of native coronary artery without angina pectoris: Secondary | ICD-10-CM | POA: Diagnosis present

## 2016-07-01 DIAGNOSIS — N183 Chronic kidney disease, stage 3 (moderate): Secondary | ICD-10-CM | POA: Diagnosis present

## 2016-07-01 DIAGNOSIS — Z87891 Personal history of nicotine dependence: Secondary | ICD-10-CM

## 2016-07-01 DIAGNOSIS — D62 Acute posthemorrhagic anemia: Secondary | ICD-10-CM | POA: Diagnosis not present

## 2016-07-01 DIAGNOSIS — N529 Male erectile dysfunction, unspecified: Secondary | ICD-10-CM | POA: Diagnosis present

## 2016-07-01 DIAGNOSIS — C61 Malignant neoplasm of prostate: Principal | ICD-10-CM | POA: Diagnosis present

## 2016-07-01 DIAGNOSIS — B1910 Unspecified viral hepatitis B without hepatic coma: Secondary | ICD-10-CM | POA: Diagnosis present

## 2016-07-01 DIAGNOSIS — Z79899 Other long term (current) drug therapy: Secondary | ICD-10-CM

## 2016-07-01 DIAGNOSIS — K219 Gastro-esophageal reflux disease without esophagitis: Secondary | ICD-10-CM | POA: Diagnosis present

## 2016-07-01 DIAGNOSIS — Z96653 Presence of artificial knee joint, bilateral: Secondary | ICD-10-CM | POA: Diagnosis present

## 2016-07-01 DIAGNOSIS — I129 Hypertensive chronic kidney disease with stage 1 through stage 4 chronic kidney disease, or unspecified chronic kidney disease: Secondary | ICD-10-CM | POA: Diagnosis present

## 2016-07-01 HISTORY — PX: PELVIC LYMPH NODE DISSECTION: SHX6543

## 2016-07-01 HISTORY — PX: ROBOT ASSISTED LAPAROSCOPIC RADICAL PROSTATECTOMY: SHX5141

## 2016-07-01 LAB — CBC
HCT: 33.9 % — ABNORMAL LOW (ref 40.0–52.0)
Hemoglobin: 11.4 g/dL — ABNORMAL LOW (ref 13.0–18.0)
MCH: 29.7 pg (ref 26.0–34.0)
MCHC: 33.6 g/dL (ref 32.0–36.0)
MCV: 88.4 fL (ref 80.0–100.0)
PLATELETS: 198 10*3/uL (ref 150–440)
RBC: 3.84 MIL/uL — ABNORMAL LOW (ref 4.40–5.90)
RDW: 13.3 % (ref 11.5–14.5)
WBC: 13.6 10*3/uL — ABNORMAL HIGH (ref 3.8–10.6)

## 2016-07-01 LAB — ABO/RH: ABO/RH(D): AB POS

## 2016-07-01 LAB — TROPONIN I
Troponin I: <0.03 ng/mL (ref <0.03)
Troponin I: <0.03 ng/mL (ref <0.03)

## 2016-07-01 SURGERY — ROBOTIC ASSISTED LAPAROSCOPIC RADICAL PROSTATECTOMY
Anesthesia: General | Site: Abdomen | Wound class: Clean Contaminated

## 2016-07-01 MED ORDER — PROPOFOL 10 MG/ML IV BOLUS
INTRAVENOUS | Status: AC
  Filled 2016-07-01: qty 20

## 2016-07-01 MED ORDER — LIDOCAINE HCL (CARDIAC) 20 MG/ML IV SOLN
INTRAVENOUS | Status: DC | PRN
  Administered 2016-07-01: 40 mg via INTRAVENOUS

## 2016-07-01 MED ORDER — CEFAZOLIN SODIUM-DEXTROSE 2-4 GM/100ML-% IV SOLN
INTRAVENOUS | Status: AC
  Filled 2016-07-01: qty 100

## 2016-07-01 MED ORDER — LIDOCAINE HCL (PF) 2 % IJ SOLN
INTRAMUSCULAR | Status: AC
  Filled 2016-07-01: qty 2

## 2016-07-01 MED ORDER — MIDAZOLAM HCL 2 MG/2ML IJ SOLN
INTRAMUSCULAR | Status: DC | PRN
  Administered 2016-07-01: 2 mg via INTRAVENOUS

## 2016-07-01 MED ORDER — SEVOFLURANE IN SOLN
RESPIRATORY_TRACT | Status: AC
  Filled 2016-07-01: qty 250

## 2016-07-01 MED ORDER — MIDAZOLAM HCL 2 MG/2ML IJ SOLN
INTRAMUSCULAR | Status: AC
  Filled 2016-07-01: qty 2

## 2016-07-01 MED ORDER — EPHEDRINE SULFATE 50 MG/ML IJ SOLN
INTRAMUSCULAR | Status: AC
  Filled 2016-07-01: qty 1

## 2016-07-01 MED ORDER — LACTATED RINGERS IV BOLUS (SEPSIS)
500.0000 mL | Freq: Once | INTRAVENOUS | Status: AC
  Administered 2016-07-01: 500 mL via INTRAVENOUS

## 2016-07-01 MED ORDER — EPHEDRINE SULFATE 50 MG/ML IJ SOLN
INTRAMUSCULAR | Status: DC | PRN
  Administered 2016-07-01 (×2): 10 mg via INTRAVENOUS

## 2016-07-01 MED ORDER — EZETIMIBE 10 MG PO TABS
10.0000 mg | ORAL_TABLET | Freq: Every day | ORAL | Status: DC
Start: 2016-07-01 — End: 2016-07-03
  Administered 2016-07-01 – 2016-07-03 (×3): 10 mg via ORAL
  Filled 2016-07-01 (×3): qty 1

## 2016-07-01 MED ORDER — FENTANYL CITRATE (PF) 100 MCG/2ML IJ SOLN
INTRAMUSCULAR | Status: DC | PRN
  Administered 2016-07-01 (×6): 50 ug via INTRAVENOUS

## 2016-07-01 MED ORDER — MORPHINE SULFATE (PF) 4 MG/ML IV SOLN: 2.0000 mg | INTRAVENOUS | Status: DC | PRN

## 2016-07-01 MED ORDER — ROCURONIUM BROMIDE 50 MG/5ML IV SOLN
INTRAVENOUS | Status: AC
  Filled 2016-07-01: qty 1

## 2016-07-01 MED ORDER — ATORVASTATIN CALCIUM 20 MG PO TABS
40.0000 mg | ORAL_TABLET | Freq: Every day | ORAL | Status: DC
  Administered 2016-07-01 – 2016-07-03 (×3): 40 mg via ORAL
  Filled 2016-07-01 (×3): qty 2

## 2016-07-01 MED ORDER — ONDANSETRON HCL 4 MG/2ML IJ SOLN
INTRAMUSCULAR | Status: AC
  Filled 2016-07-01: qty 2

## 2016-07-01 MED ORDER — HYDROCHLOROTHIAZIDE 12.5 MG PO CAPS
12.5000 mg | ORAL_CAPSULE | Freq: Every day | ORAL | Status: DC
  Administered 2016-07-01 – 2016-07-03 (×3): 12.5 mg via ORAL
  Filled 2016-07-01 (×3): qty 1

## 2016-07-01 MED ORDER — FAMOTIDINE 20 MG PO TABS
ORAL_TABLET | ORAL | Status: AC
  Filled 2016-07-01: qty 1

## 2016-07-01 MED ORDER — FENTANYL CITRATE (PF) 100 MCG/2ML IJ SOLN
INTRAMUSCULAR | Status: AC
  Filled 2016-07-01: qty 2

## 2016-07-01 MED ORDER — ACETAMINOPHEN 10 MG/ML IV SOLN: INTRAVENOUS | Status: DC | PRN

## 2016-07-01 MED ORDER — PHENYLEPHRINE HCL 10 MG/ML IJ SOLN
INTRAVENOUS | Status: DC | PRN
  Administered 2016-07-01: 30 ug/min via INTRAVENOUS

## 2016-07-01 MED ORDER — SODIUM CHLORIDE 0.9 % IV SOLN
INTRAVENOUS | Status: DC
  Administered 2016-07-02: via INTRAVENOUS
  Administered 2016-07-02: 1000 mL via INTRAVENOUS
  Administered 2016-07-02: 22:00:00 via INTRAVENOUS

## 2016-07-01 MED ORDER — GLYCOPYRROLATE 0.2 MG/ML IJ SOLN
INTRAMUSCULAR | Status: DC | PRN
  Administered 2016-07-01: .2 mg via INTRAVENOUS

## 2016-07-01 MED ORDER — DILTIAZEM HCL 25 MG/5ML IV SOLN: 25.0000 mg | Freq: Once | INTRAVENOUS | Status: DC

## 2016-07-01 MED ORDER — HYDROCODONE-ACETAMINOPHEN 5-325 MG PO TABS: 1.0000 | ORAL_TABLET | ORAL | 0 refills | Status: DC | PRN

## 2016-07-01 MED ORDER — DILTIAZEM LOAD VIA INFUSION
25.0000 mg | Freq: Once | INTRAVENOUS | Status: DC
  Filled 2016-07-01: qty 25

## 2016-07-01 MED ORDER — PROPOFOL 10 MG/ML IV BOLUS
INTRAVENOUS | Status: DC | PRN
  Administered 2016-07-01: 150 mg via INTRAVENOUS

## 2016-07-01 MED ORDER — CEFAZOLIN SODIUM-DEXTROSE 2-4 GM/100ML-% IV SOLN
2.0000 g | Freq: Three times a day (TID) | INTRAVENOUS | Status: DC
  Administered 2016-07-01 – 2016-07-03 (×6): 2 g via INTRAVENOUS
  Filled 2016-07-01 (×9): qty 100

## 2016-07-01 MED ORDER — DEXAMETHASONE SODIUM PHOSPHATE 10 MG/ML IJ SOLN
INTRAMUSCULAR | Status: DC | PRN
  Administered 2016-07-01: 10 mg via INTRAVENOUS

## 2016-07-01 MED ORDER — ONDANSETRON HCL 4 MG/2ML IJ SOLN: 4.0000 mg | INTRAMUSCULAR | Status: DC | PRN

## 2016-07-01 MED ORDER — CEFAZOLIN SODIUM-DEXTROSE 2-4 GM/100ML-% IV SOLN
2.0000 g | INTRAVENOUS | Status: AC
  Administered 2016-07-01: 2 g via INTRAVENOUS

## 2016-07-01 MED ORDER — LACTATED RINGERS IV SOLN
INTRAVENOUS | Status: DC
  Administered 2016-07-01 (×6): via INTRAVENOUS

## 2016-07-01 MED ORDER — OMEPRAZOLE MAGNESIUM 20 MG PO TBEC: 20.0000 mg | DELAYED_RELEASE_TABLET | Freq: Every day | ORAL | Status: DC

## 2016-07-01 MED ORDER — PHENYLEPHRINE HCL 10 MG/ML IJ SOLN
INTRAMUSCULAR | Status: DC | PRN
  Administered 2016-07-01 (×2): 100 ug via INTRAVENOUS
  Administered 2016-07-01: 150 ug via INTRAVENOUS
  Administered 2016-07-01 (×3): 100 ug via INTRAVENOUS

## 2016-07-01 MED ORDER — PNEUMOCOCCAL VAC POLYVALENT 25 MCG/0.5ML IJ INJ
0.5000 mL | INJECTION | INTRAMUSCULAR | Status: AC
  Administered 2016-07-03: 0.5 mL via INTRAMUSCULAR
  Filled 2016-07-01: qty 0.5

## 2016-07-01 MED ORDER — LOSARTAN POTASSIUM 50 MG PO TABS
100.0000 mg | ORAL_TABLET | Freq: Every day | ORAL | Status: DC
  Administered 2016-07-01 – 2016-07-03 (×3): 100 mg via ORAL
  Filled 2016-07-01 (×3): qty 2

## 2016-07-01 MED ORDER — ONDANSETRON HCL 4 MG/2ML IJ SOLN
INTRAMUSCULAR | Status: DC | PRN
Start: 2016-07-01 — End: 2016-07-01
  Administered 2016-07-01: 4 mg via INTRAVENOUS

## 2016-07-01 MED ORDER — FENTANYL CITRATE (PF) 250 MCG/5ML IJ SOLN
INTRAMUSCULAR | Status: AC
  Filled 2016-07-01: qty 5

## 2016-07-01 MED ORDER — DILTIAZEM HCL 100 MG IV SOLR
5.0000 mg/h | INTRAVENOUS | Status: DC
  Administered 2016-07-01: 25 mg via INTRAVENOUS
  Filled 2016-07-01: qty 100

## 2016-07-01 MED ORDER — ONDANSETRON HCL 4 MG/2ML IJ SOLN: 4.0000 mg | Freq: Once | INTRAMUSCULAR | Status: DC | PRN

## 2016-07-01 MED ORDER — DOCUSATE SODIUM 100 MG PO CAPS
100.0000 mg | ORAL_CAPSULE | Freq: Two times a day (BID) | ORAL | Status: DC
  Administered 2016-07-01 – 2016-07-03 (×5): 100 mg via ORAL
  Filled 2016-07-01 (×5): qty 1

## 2016-07-01 MED ORDER — LOSARTAN POTASSIUM-HCTZ 100-12.5 MG PO TABS: 1.0000 | ORAL_TABLET | Freq: Every day | ORAL | Status: DC

## 2016-07-01 MED ORDER — HEPARIN SODIUM (PORCINE) 5000 UNIT/ML IJ SOLN
5000.0000 [IU] | Freq: Three times a day (TID) | INTRAMUSCULAR | Status: DC
  Administered 2016-07-01 – 2016-07-03 (×6): 5000 [IU] via SUBCUTANEOUS
  Filled 2016-07-01 (×6): qty 1

## 2016-07-01 MED ORDER — BUPIVACAINE-EPINEPHRINE (PF) 0.5% -1:200000 IJ SOLN
INTRAMUSCULAR | Status: AC
  Filled 2016-07-01: qty 30

## 2016-07-01 MED ORDER — DEXAMETHASONE SODIUM PHOSPHATE 10 MG/ML IJ SOLN: INTRAMUSCULAR | Status: DC | PRN

## 2016-07-01 MED ORDER — FENTANYL CITRATE (PF) 100 MCG/2ML IJ SOLN
25.0000 ug | INTRAMUSCULAR | Status: AC | PRN
  Administered 2016-07-01 (×6): 25 ug via INTRAVENOUS

## 2016-07-01 MED ORDER — FAMOTIDINE 20 MG PO TABS
20.0000 mg | ORAL_TABLET | Freq: Once | ORAL | Status: AC
  Administered 2016-07-01: 20 mg via ORAL

## 2016-07-01 MED ORDER — FENTANYL CITRATE (PF) 100 MCG/2ML IJ SOLN
INTRAMUSCULAR | Status: AC
  Administered 2016-07-01: 25 ug via INTRAVENOUS
  Filled 2016-07-01: qty 2

## 2016-07-01 MED ORDER — BUPIVACAINE-EPINEPHRINE (PF) 0.5% -1:200000 IJ SOLN
INTRAMUSCULAR | Status: DC | PRN
  Administered 2016-07-01: 30 mL

## 2016-07-01 MED ORDER — THROMBIN 5000 UNITS EX SOLR
CUTANEOUS | Status: DC | PRN
  Administered 2016-07-01: 5000 [IU] via TOPICAL

## 2016-07-01 MED ORDER — SERTRALINE HCL 100 MG PO TABS
100.0000 mg | ORAL_TABLET | Freq: Every day | ORAL | Status: DC
  Administered 2016-07-01 – 2016-07-03 (×3): 100 mg via ORAL
  Filled 2016-07-01 (×3): qty 2

## 2016-07-01 MED ORDER — SUGAMMADEX SODIUM 200 MG/2ML IV SOLN
INTRAVENOUS | Status: AC
  Filled 2016-07-01: qty 2

## 2016-07-01 MED ORDER — HYDROCODONE-ACETAMINOPHEN 5-325 MG PO TABS
1.0000 | ORAL_TABLET | ORAL | Status: DC | PRN
  Administered 2016-07-02 (×3): 2 via ORAL
  Filled 2016-07-01 (×3): qty 2

## 2016-07-01 MED ORDER — SUGAMMADEX SODIUM 200 MG/2ML IV SOLN
INTRAVENOUS | Status: DC | PRN
  Administered 2016-07-01: 215 mg via INTRAVENOUS

## 2016-07-01 MED ORDER — PANTOPRAZOLE SODIUM 40 MG PO TBEC
40.0000 mg | DELAYED_RELEASE_TABLET | Freq: Every day | ORAL | Status: DC
  Administered 2016-07-01 – 2016-07-03 (×3): 40 mg via ORAL
  Filled 2016-07-01 (×3): qty 1

## 2016-07-01 MED ORDER — ACETAMINOPHEN 10 MG/ML IV SOLN
INTRAVENOUS | Status: AC
  Filled 2016-07-01: qty 100

## 2016-07-01 MED ORDER — ROCURONIUM BROMIDE 100 MG/10ML IV SOLN
INTRAVENOUS | Status: DC | PRN
  Administered 2016-07-01 (×3): 10 mg via INTRAVENOUS
  Administered 2016-07-01: 50 mg via INTRAVENOUS
  Administered 2016-07-01: 15 mg via INTRAVENOUS
  Administered 2016-07-01 (×2): 10 mg via INTRAVENOUS

## 2016-07-01 MED ORDER — ACETAMINOPHEN 10 MG/ML IV SOLN
INTRAVENOUS | Status: DC | PRN
  Administered 2016-07-01: 1000 mg via INTRAVENOUS

## 2016-07-01 MED ORDER — HEPARIN SODIUM (PORCINE) 5000 UNIT/ML IJ SOLN
INTRAMUSCULAR | Status: AC
  Filled 2016-07-01: qty 1

## 2016-07-01 MED ORDER — THROMBIN 5000 UNITS EX SOLR
CUTANEOUS | Status: AC
  Filled 2016-07-01: qty 5000

## 2016-07-01 SURGICAL SUPPLY — 104 items
ANCHOR TIS RET SYS 235ML (MISCELLANEOUS) ×3 IMPLANT
APPLICATOR ARISTA FLEXITIP XL (MISCELLANEOUS) ×3 IMPLANT
APPLICATOR SURGIFLO ENDO (HEMOSTASIS) ×3 IMPLANT
APPLIER CLIP LOGIC TI 5 (MISCELLANEOUS) IMPLANT
BAG URO DRAIN 2000ML W/SPOUT (MISCELLANEOUS) ×6 IMPLANT
BLADE CLIPPER SURG (BLADE) ×3 IMPLANT
BULB RESERV EVAC DRAIN JP 100C (MISCELLANEOUS) ×3 IMPLANT
CANISTER SUCT 1200ML W/VALVE (MISCELLANEOUS) ×3 IMPLANT
CATH FOL 2WAY LX 16X5 (CATHETERS) IMPLANT
CATH FOL 2WAY LX 18X5 (CATHETERS) ×6 IMPLANT
CHLORAPREP W/TINT 26ML (MISCELLANEOUS) ×6 IMPLANT
CLEANER CAUTERY TIP 5X5 PAD (MISCELLANEOUS) ×1 IMPLANT
CLIP LIGATING HEM O LOK PURPLE (MISCELLANEOUS) ×12 IMPLANT
CLIP SUT LAPRA TY ABSORB (SUTURE) IMPLANT
CORD BIP STRL DISP 12FT (MISCELLANEOUS) ×3 IMPLANT
CORD MONOPOLAR M/FML 12FT (MISCELLANEOUS) ×3 IMPLANT
COVER TIP SHEARS 8 DVNC (MISCELLANEOUS) ×2 IMPLANT
COVER TIP SHEARS 8MM DA VINCI (MISCELLANEOUS) ×4
DEFOGGER SCOPE WARMER CLEARIFY (MISCELLANEOUS) ×6 IMPLANT
DERMABOND ADVANCED (GAUZE/BANDAGES/DRESSINGS) ×4
DERMABOND ADVANCED .7 DNX12 (GAUZE/BANDAGES/DRESSINGS) ×2 IMPLANT
DRAIN CHANNEL JP 15F RND 16 (MISCELLANEOUS) ×3 IMPLANT
DRAIN CHANNEL JP 19F (MISCELLANEOUS) IMPLANT
DRAPE LEGGINS SURG 28X43 STRL (DRAPES) ×3 IMPLANT
DRAPE SHEET LG 3/4 BI-LAMINATE (DRAPES) ×3 IMPLANT
DRAPE SURG 17X11 SM STRL (DRAPES) ×15 IMPLANT
DRAPE UNDER BUTTOCK W/FLU (DRAPES) ×3 IMPLANT
DRIVER LRG NEEDLE DA VINCI (INSTRUMENTS) ×4
DRIVER NDLE LRG DVNC (INSTRUMENTS) ×2 IMPLANT
DRSG TELFA 3X8 NADH (GAUZE/BANDAGES/DRESSINGS) ×3 IMPLANT
ELECT REM PT RETURN 9FT ADLT (ELECTROSURGICAL) ×3
ELECTRODE REM PT RTRN 9FT ADLT (ELECTROSURGICAL) ×1 IMPLANT
GLOVE BIO SURGEON STRL SZ 6.5 (GLOVE) ×10 IMPLANT
GLOVE BIO SURGEONS STRL SZ 6.5 (GLOVE) ×5
GLOVE BIOGEL M 7.0 STRL (GLOVE) ×6 IMPLANT
GLOVE BIOGEL PI IND STRL 7.5 (GLOVE) ×4 IMPLANT
GLOVE BIOGEL PI INDICATOR 7.5 (GLOVE) ×8
GLOVE INDICATOR 7.0 STRL GRN (GLOVE) ×3 IMPLANT
GOWN STRL REUS W/ TWL LRG LVL3 (GOWN DISPOSABLE) ×6 IMPLANT
GOWN STRL REUS W/TWL LRG LVL3 (GOWN DISPOSABLE) ×12
GRASPER SUT TROCAR 14GX15 (MISCELLANEOUS) ×3 IMPLANT
HEMOSTAT ARISTA ABSORB 3G PWDR (MISCELLANEOUS) ×3 IMPLANT
HEMOSTAT SURGICEL 2X14 (HEMOSTASIS) IMPLANT
HOLDER FOLEY CATH W/STRAP (MISCELLANEOUS) ×3 IMPLANT
IRRIGATION STRYKERFLOW (MISCELLANEOUS) ×1 IMPLANT
IRRIGATOR STRYKERFLOW (MISCELLANEOUS) ×3
IV LACTATED RINGERS 1000ML (IV SOLUTION) ×3 IMPLANT
IV NS 1000ML (IV SOLUTION) ×2
IV NS 1000ML BAXH (IV SOLUTION) ×1 IMPLANT
KIT ACCESSORY DA VINCI DISP (KITS) ×2
KIT ACCESSORY DVNC DISP (KITS) ×1 IMPLANT
KIT PINK PAD W/HEAD ARE REST (MISCELLANEOUS) ×3
KIT PINK PAD W/HEAD ARM REST (MISCELLANEOUS) ×1 IMPLANT
KIT RM TURNOVER CYSTO AR (KITS) ×3 IMPLANT
LOOP RED MAXI  1X406MM (MISCELLANEOUS)
LOOP VESSEL MAXI 1X406 RED (MISCELLANEOUS) IMPLANT
NDL SAFETY 18GX1.5 (NEEDLE) ×3 IMPLANT
NEEDLE HYPO 25X1 1.5 SAFETY (NEEDLE) ×3 IMPLANT
NEEDLE INSUFFLATION 14GA 120MM (NEEDLE) ×3 IMPLANT
PACK LAP CHOLECYSTECTOMY (MISCELLANEOUS) ×3 IMPLANT
PAD CLEANER CAUTERY TIP 5X5 (MISCELLANEOUS) ×2
PENCIL ELECTRO HAND CTR (MISCELLANEOUS) ×3 IMPLANT
PORT ACCESS TROCAR AIRSEAL 12 (TROCAR) ×1 IMPLANT
PORT ACCESS TROCAR AIRSEAL 5M (TROCAR) ×2
PROGRASP ENDOWRIST DA VINCI (INSTRUMENTS) ×4
PROGRASP ENDOWRIST DVNC (INSTRUMENTS) ×2 IMPLANT
SCISSORS METZENBAUM CVD 33 (INSTRUMENTS) IMPLANT
SLEEVE ENDOPATH XCEL 5M (ENDOMECHANICALS) ×6 IMPLANT
SOL PREP PVP 2OZ (MISCELLANEOUS)
SOLUTION ELECTROLUBE (MISCELLANEOUS) ×3 IMPLANT
SOLUTION PREP PVP 2OZ (MISCELLANEOUS) IMPLANT
SPOGE SURGIFLO 8M (HEMOSTASIS) ×2
SPONGE SURGIFLO 8M (HEMOSTASIS) ×1 IMPLANT
SPONGE VERSALON 4X4 4PLY (MISCELLANEOUS) IMPLANT
STAPLER SKIN PROX 35W (STAPLE) ×3 IMPLANT
STRAP SAFETY BODY (MISCELLANEOUS) ×3 IMPLANT
SUT DVC VLOC 90 3-0 CV23 UNDY (SUTURE) ×6 IMPLANT
SUT DVC VLOC 90 3-0 CV23 VLT (SUTURE) ×3
SUT ETHILON 3-0 FS-10 30 BLK (SUTURE) ×3
SUT MNCRL 4-0 (SUTURE) ×4
SUT MNCRL 4-0 27XMFL (SUTURE) ×2
SUT PROLENE 5 0 PS 3 (SUTURE) IMPLANT
SUT SILK 0 (SUTURE) ×2
SUT SILK 0 30XBRD TIE 6 (SUTURE) ×1 IMPLANT
SUT SILK 2 0 SH (SUTURE) IMPLANT
SUT VIC AB 0 CT1 36 (SUTURE) ×6 IMPLANT
SUT VIC AB 2-0 CT1 (SUTURE) IMPLANT
SUT VIC AB 2-0 SH 27 (SUTURE) ×10
SUT VIC AB 2-0 SH 27XBRD (SUTURE) ×5 IMPLANT
SUT VICRYL 0 AB UR-6 (SUTURE) ×18 IMPLANT
SUTURE DVC VLC 90 3-0 CV23 VLT (SUTURE) ×1 IMPLANT
SUTURE EHLN 3-0 FS-10 30 BLK (SUTURE) ×1 IMPLANT
SUTURE MNCRL 4-0 27XMF (SUTURE) ×2 IMPLANT
SYRINGE 10CC LL (SYRINGE) ×3 IMPLANT
SYRINGE IRR TOOMEY STRL 70CC (SYRINGE) ×9 IMPLANT
TAPE CLOTH 10X20 WHT NS LF (TAPE) ×2 IMPLANT
TAPE CLOTH 2X10 WHT NS LF (TAPE) ×4
TOWEL OR 17X26 4PK STRL BLUE (TOWEL DISPOSABLE) IMPLANT
TROCAR DISP BLADELESS 8 DVNC (TROCAR) ×1 IMPLANT
TROCAR DISP BLADELESS 8MM (TROCAR) ×2
TROCAR ENDOPATH XCEL 12X100 BL (ENDOMECHANICALS) ×6 IMPLANT
TROCAR XCEL 12X100 BLDLESS (ENDOMECHANICALS) ×3 IMPLANT
TROCAR XCEL NON-BLD 5MMX100MML (ENDOMECHANICALS) ×3 IMPLANT
TUBING INSUFFLATOR HI FLOW (MISCELLANEOUS) ×3 IMPLANT

## 2016-07-01 NOTE — Op Note (Signed)
Date of procedure: 07/01/16  Preoperative diagnosis:  1. Clinically localized prostate cancer   Postoperative diagnosis:  1. Clinically localized prostate cancer   Procedure: 1. Robotic-assisted laparoscopic prostatectomy 2. Pelvic lymph node dissection  Surgeon: Baruch Gouty, MD  Assistant: Liliane Bade, PA  Intraoperative second opinion: Hollice Espy, M.D.  Anesthesia: General  Complications: None  Intraoperative findings: The patient had successful removal of his prostate robotically. There was no gross evidence of extraprostatic extension or metastatic disease.  EBL:  800 cc  Specimens:  Prostate and seminal vesicles, periprostatic fat, bladder neck margin, right pelvic lymph nodes, left pelvic lymph nodes  Drains:  18 French Foley catheter, 15 [place] JP drain  Disposition: Stable to the postanesthesia care unit  Indication for procedure: The patient is a 61 y.o. male with  clinically localized Gleason 7 prostate cancer is elected to undergo robotic prostatectomy with pelvic lymph node dissection.  After reviewing the management options for treatment, the patient elected to proceed with the above surgical procedure(s). We have discussed the potential benefits and risks of the procedure, side effects of the proposed treatment, the likelihood of the patient achieving the goals of the procedure, and any potential problems that might occur during the procedure or recuperation. Informed consent has been obtained.  Description of procedure: The patient was met in the preoperative area. All risks, benefits, and indications of the procedure were described in great detail. The patient consented to the procedure. Preoperative antibiotics were given. The patient was taken to the operative theater. General anesthesia was induced per the anesthesia service. The patient was then placed in the dorsal lithotomy position and properly padded and secured. He was prepped and draped in the  usual sterile fashion. A preoperative timeout was called.   An 23 French Foley catheter with a suture tied to the end of the catheter was placed per urethra atraumatically. Due to the patient's previous midline umbilical hernia repair, decision was made to enter the abdomen in the left upper quadrant 2 finger breaths below the ribs in the midclavicular line. A 5 mm incision was made, and the Veress needle was placed. Correct placement was confirmed with no aspiration of blood and a negative drop test. The abdomen was then insufflated to 15 mmHg. A 5 mm laparoscopic port was then placed with visual guidance. The peritoneum is entered. The abdomen was inspected and there is noted be some adhesions in the midline superior aspect of the abdomen. At this point, it was unknown prior to this the patient did have mesh with this hernia. It was seen. There is no adhesions near this mass. The 12 mm camera port was placed superior to the umbilicus in the usual location. After incising skin, was placed under direct visualization were easily punctured through his mesh. The 8 mm laparoscopic ports 3 were then placed in the usual robotic prostatectomy location under direct visualization. A 12 mm assistant port was placed in the right lateral lower quadrant under direct visualization. A second 5 mm port was then placed in the right side between the robotic port and the camera port. The robot was then docked. At this point first it was anterior dissection dropping the bladder. The medial umbilical ligaments were incised and peritoneum was also incised. The bladder was carefully dissected free from the anterior abdominal wall until the pelvic bone was seen. The prostate was then defatted and this was sent as periprosthetic fat. The superior venous complex was ligated. The endopelvic fascia was incised both sides  and opened. A 2-0 Vicryl was then placed on the dorsal venous complex. During this part of procedure, there was some  oozing that did slow the procedure but eventually this was controlled. With help of bouncing the Foley balloon at the bladder neck, the location of the bladder neck or meets the prostate was determined and the bladder neck was incised until the catheter was seen. The balloon was deflated and secured to the abdominal wall with the help of a [place] and the previously placed suture at the end of the catheter for retraction. At this point the posterior bladder neck was completely incised and the prostate. Dissection then took place posterior were at this point we realized that we were entering into the prostate itself. The dissection plane was 3 initiated into the proper plane was discovered. At this point, irrigated and close to her respect to see the vas deference and the posterior portion of the procedure, however it was difficult to reach this area, so an attempt was made to mobilize the prostate by taking down the prostate pedicles starting on the left. This point, we ran into issues with bleeding and experienced some blood loss. Essentially determined that we had entered the prostate and we are medial superior to where the pedicles were. However, due to the bleeding and a significant time spent on this portion the procedure without making progress, Dr. Hollice Espy was called in for a second opinion. She was able to reorient our dissection and pelvis to determine the correct location of the right left pedicles. She also help dissect free the seminal vesicles and vas deferens using electrocautery. At this point, with the correct plane now reentered, I was able to retract the prostate using the seminal vesicles and vas deferens. The prostate was then pushed anteriorly to dissect it free from the rectum up to the distal prostate. The pedicles were then isolated into small packets in each of these packets were clipped with locking clip. Cautery was then used to incise the vessels on the prostate side of the  clip. This was done bilaterally into both testicles were freed. The prostate was then freed from the lateral attachments up to the level of the bladder neck. At this time, the prostate was only attached by the DVC which had been ligated but not incised as well as the distal urethra. On reinspection of the DVC, it appeared that the previously placed DVC suture was too proximal. I attempted to manipulate the DVC to make it easier to tie off distally which led to it began to bleed. This point there is significant bleeding that required placement of another 0 Vicryl suture distal to the previous placed ones. This minimized the bleeding, but there still loose. A second 0 Vicryl figure-of-eight was then placed proximal to the previous one resulting in hemostasis. The DVC was then incised proximal to the sutures with no active bleeding. At this point the only remaining attachment was the urethra the urethra was milked from the prostate to maximize length. The urethra was then incised without cautery until the anterior portion was incised. The Foley catheter was then withdrawn. Using my left hand, I placed it posterior to the urethra and into the rectum to protect it and the remaining urethra was cut without cautery. The prostate was then completely free and placed in a specimen bag. At this point hemostasis was obtained. 2 internal rectal injury, the abdomen was filled with water and his rectum was irrigated with air which  showed no bubbling. After removing the irrigant from the abdomen, a rectal exam was also performed which did not show any blood on the glove and the glove was not visible inside the abdomen. At this point, the anastomosis was performed with a 2-0 self locking absorbable suture by tying 2 individual sutures together to make a double-armed suture. The bladder was then anastomosed to the urethra starting at the 6:00 position and working our way anteriorly with each suture on each side. After good  approximation of the bladder neck and complete 360 anastomosis and the 2 arms were tied together. The bladder was irrigated with 120 cc of normal saline with no leakage. Irrigation also revealed no clots. This was done through a new 39 [place] Foley catheter was placed before tying the 2 arms of the anastomosis sutured together. 15 cc was placed in the balloon. This point a bilateral pelvic lymph node dissection took place. This took place on both sides with resection of lymph nodes between the iliac vein, pubic arch, and obturator nerve. The obturator nerve was visualized and intact after both pelvic lymph node dissections. The lymph nodes were sent individually. Hemostasis was excellent. Surgiflow and Janese Banks were then placed in the bed of the resection as well as in the lymph node bed. A 15 [place] JP drain was then placed through the most lateral probiotic incision and placed over the bladder. The lateral 12 mm assistant port was closed with an 0 Vicryl to the fascia with the assistance of Elig[place] under direct visualization. At this point the midline incision was enlarged approximately 5 cm to remove the prostate which improved to place in a bag. The lymph nodes removed through the port and there dissected. Once the incision was large the prostate was removed. The edges were the prostate had become inadvertently entered were reapproximated for pathology to ensure that this did not give false positive margins. This was done on the back table to the procedure. At this point and the recommendation of the on-call general surgeon, primary closure of the fascia in the midline was recommended with attempt to incorporate any residual graft presented and surgery. This was done in interrupted fashion with 0 Vicryl. The incisions were then reapproximated subcuticularly with 4-0 chromic. The drain was fixed the drains stitch. The patient's anesthesia and transferred in stable condition to postanesthesia care unit.    Liliane Bade, PA provided necessary bedside assistance during the procedure as no qualified resident was available.  Plan: The patient will be admitted to the floor overnight. He will continue his Foley catheter for 7-10 days. We'll check a JP creatinine at 5 AM in the morning with a goal of removing it prior to discharge home.   Baruch Gouty, M.D.

## 2016-07-01 NOTE — Transfer of Care (Signed)
Immediate Anesthesia Transfer of Care Note  Patient: Daniel Jackson  Procedure(s) Performed: Procedure(s): ROBOTIC ASSISTED LAPAROSCOPIC  PROSTATECTOMY (N/A) PELVIC LYMPH NODE DISSECTION (N/A)  Patient Location: PACU  Anesthesia Type:General  Level of Consciousness: awake  Airway & Oxygen Therapy: Patient Spontanous Breathing and Patient connected to face mask oxygen  Post-op Assessment: Report given to RN and Post -op Vital signs reviewed and stable  Post vital signs: Reviewed and unstable  Last Vitals:  Vitals:   07/01/16 0638  BP: 135/87  Pulse: (!) 55  Resp: 12  Temp: 36.6 C    Last Pain:  Vitals:   07/01/16 0638  TempSrc: Oral         Complications: No apparent anesthesia complications, pt. In new rapid atrial fib at 133, Dr Kayleen Memos consulted
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
## 2016-07-01 NOTE — Anesthesia Preprocedure Evaluation (Addendum)
Anesthesia Evaluation  Patient identified by MRN, date of birth, ID band Patient awake    Reviewed: Allergy & Precautions, NPO status , Patient's Chart, lab work & pertinent test results  History of Anesthesia Complications Negative for: history of anesthetic complications  Airway Mallampati: III       Dental   Pulmonary neg pulmonary ROS, former smoker,           Cardiovascular hypertension, Pt. on medications + CAD       Neuro/Psych    GI/Hepatic GERD  Medicated and Controlled,(+) Hepatitis -, B  Endo/Other    Renal/GU Renal InsufficiencyRenal disease     Musculoskeletal   Abdominal   Peds  Hematology  (+) anemia ,   Anesthesia Other Findings   Reproductive/Obstetrics                             Anesthesia Physical Anesthesia Plan  ASA: III  Anesthesia Plan: General   Post-op Pain Management:    Induction:   PONV Risk Score and Plan: 2 and Ondansetron and Dexamethasone  Airway Management Planned: Oral ETT  Additional Equipment:   Intra-op Plan:   Post-operative Plan:   Informed Consent: I have reviewed the patients History and Physical, chart, labs and discussed the procedure including the risks, benefits and alternatives for the proposed anesthesia with the patient or authorized representative who has indicated his/her understanding and acceptance.     Plan Discussed with:   Anesthesia Plan Comments:         Anesthesia Quick Evaluation

## 2016-07-01 NOTE — Telephone Encounter (Signed)
-----   Message from Nickie Retort, MD sent at 07/01/2016  2:51 PM EDT ----- Patient needs to see me in 10 days to discuss path results and remove foley. thanks

## 2016-07-01 NOTE — Anesthesia Post-op Follow-up Note (Cosign Needed)
Anesthesia QCDR form completed.        

## 2016-07-01 NOTE — Telephone Encounter (Signed)
App made ° ° °[name]  °

## 2016-07-01 NOTE — Anesthesia Procedure Notes (Signed)
Procedure Name: Intubation Date/Time: 07/01/2016 7:55 AM Performed by: Allean Found Pre-anesthesia Checklist: Patient identified, Emergency Drugs available, Suction available, Patient being monitored and Timeout performed Patient Re-evaluated:Patient Re-evaluated prior to inductionOxygen Delivery Method: Circle system utilized Preoxygenation: Pre-oxygenation with 100% oxygen Intubation Type: IV induction Ventilation: Mask ventilation without difficulty Laryngoscope Size: Mac and 4 Grade View: Grade II Tube type: Oral Tube size: 7.5 mm Number of attempts: 1 Airway Equipment and Method: Stylet Placement Confirmation: ETT inserted through vocal cords under direct vision,  positive ETCO2 and breath sounds checked- equal and bilateral Secured at: 21 cm Tube secured with: Tape Dental Injury: Teeth and Oropharynx as per pre-operative assessment
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
## 2016-07-02 LAB — BASIC METABOLIC PANEL
Anion gap: 5 (ref 5–15)
BUN: 24 mg/dL — AB (ref 6–20)
CO2: 24 mmol/L (ref 22–32)
Calcium: 8.2 mg/dL — ABNORMAL LOW (ref 8.9–10.3)
Chloride: 107 mmol/L (ref 101–111)
Creatinine, Ser: 1.63 mg/dL — ABNORMAL HIGH (ref 0.61–1.24)
GFR calc Af Amer: 51 mL/min — ABNORMAL LOW (ref >60)
GFR, EST NON AFRICAN AMERICAN: 44 mL/min — AB (ref >60)
GLUCOSE: 130 mg/dL — AB (ref 65–99)
POTASSIUM: 4 mmol/L (ref 3.5–5.1)
SODIUM: 136 mmol/L (ref 135–145)

## 2016-07-02 LAB — CBC
HEMATOCRIT: 32.5 % — AB (ref 40.0–52.0)
Hemoglobin: 11 g/dL — ABNORMAL LOW (ref 13.0–18.0)
MCH: 29.5 pg (ref 26.0–34.0)
MCHC: 33.8 g/dL (ref 32.0–36.0)
MCV: 87.3 fL (ref 80.0–100.0)
PLATELETS: 191 10*3/uL (ref 150–440)
RBC: 3.72 MIL/uL — ABNORMAL LOW (ref 4.40–5.90)
RDW: 13.5 % (ref 11.5–14.5)
WBC: 12.4 10*3/uL — AB (ref 3.8–10.6)

## 2016-07-02 LAB — TYPE AND SCREEN
ABO/RH(D): AB POS
ANTIBODY SCREEN: NEGATIVE
UNIT DIVISION: 0
Unit division: 0

## 2016-07-02 LAB — TROPONIN I

## 2016-07-02 LAB — BPAM RBC
Blood Product Expiration Date: 201807012359
Blood Product Expiration Date: 201807012359
Unit Type and Rh: 6200
Unit Type and Rh: 6200

## 2016-07-02 LAB — CREATININE, FLUID (PLEURAL, PERITONEAL, JP DRAINAGE): Creat, Fluid: 1.9 mg/dL

## 2016-07-02 LAB — PREPARE RBC (CROSSMATCH)

## 2016-07-02 NOTE — Progress Notes (Signed)
1 Day Post-Op  Subjective: Daniel Jackson is doing well post op from RALP but he has persistent afib without symptoms.   He has a mild anemia with a Hgb of 11 following intraop blood loss of 990ml.  JP out but remains significant and Cr is pending.   Urine is clear.   He has been walking and has no specific complaints.  ROS:  Review of Systems  All other systems reviewed and are negative.   Anti-infectives: Anti-infectives    Start     Dose/Rate Route Frequency Ordered Stop   07/01/16 1600  ceFAZolin (ANCEF) IVPB 2g/100 mL premix     2 g 200 mL/hr over 30 Minutes Intravenous Every 8 hours 07/01/16 1351     07/01/16 0559  ceFAZolin (ANCEF) 2-4 GM/100ML-% IVPB    Comments:  KENNEDY, DENISE: cabinet override      07/01/16 0559 07/01/16 1759   07/01/16 0034  ceFAZolin (ANCEF) IVPB 2g/100 mL premix     2 g 200 mL/hr over 30 Minutes Intravenous 30 min pre-op 07/01/16 0034 07/01/16 0753   06/17/16 0745  ceFAZolin (ANCEF) 2-4 GM/100ML-% IVPB    Comments:  Rexanne Mano: cabinet override      06/17/16 0745 06/17/16 1944      Current Facility-Administered Medications  Medication Dose Route Frequency Provider Last Rate Last Dose  . 0.9 %  sodium chloride infusion   Intravenous Continuous Nickie Retort, MD 100 mL/hr at 07/02/16 0010    . atorvastatin (LIPITOR) tablet 40 mg  40 mg Oral Daily Nickie Retort, MD   40 mg at 07/01/16 2135  . ceFAZolin (ANCEF) IVPB 2g/100 mL premix  2 g Intravenous Q8H Nickie Retort, MD 200 mL/hr at 07/02/16 0534 2 g at 07/02/16 0534  . docusate sodium (COLACE) capsule 100 mg  100 mg Oral BID Nickie Retort, MD   100 mg at 07/01/16 2304  . ezetimibe (ZETIA) tablet 10 mg  10 mg Oral Daily Nickie Retort, MD   10 mg at 07/01/16 2304  . heparin injection 5,000 Units  5,000 Units Subcutaneous Q8H Nickie Retort, MD   5,000 Units at 07/02/16 0533  . losartan (COZAAR) tablet 100 mg  100 mg Oral Daily Nickie Retort, MD   100 mg at  07/01/16 2134   And  . hydrochlorothiazide (MICROZIDE) capsule 12.5 mg  12.5 mg Oral Daily Nickie Retort, MD   12.5 mg at 07/01/16 2135  . HYDROcodone-acetaminophen (NORCO/VICODIN) 5-325 MG per tablet 1-2 tablet  1-2 tablet Oral Q4H PRN Nickie Retort, MD   2 tablet at 07/02/16 0010  . morphine 4 MG/ML injection 2-4 mg  2-4 mg Intravenous Q2H PRN Nickie Retort, MD      . ondansetron [facility]) injection 4 mg  4 mg Intravenous Q4H PRN Nickie Retort, MD      . pantoprazole (PROTONIX) EC tablet 40 mg  40 mg Oral Daily Nickie Retort, MD   40 mg at 07/01/16 2134  . pneumococcal 23 valent vaccine (PNU-IMMUNE) injection 0.5 mL  0.5 mL Intramuscular Tomorrow-1000 Nickie Retort, MD      . sertraline (ZOLOFT) tablet 100 mg  100 mg Oral Daily Nickie Retort, MD   100 mg at 07/01/16 2135     Objective: Vital signs in last 24 hours: Temp:  [97.3 F (36.3 C)-98.6 F (37 C)] 97.6 F (36.4 C) (06/16 0653) Pulse Rate:  [62-125] 102 (06/16 0653) Resp:  [9-20] 16 (06/16  6967) BP: (99-130)/(49-93) 99/67 (06/16 0653) SpO2:  [93 %-99 %] 95 % (06/16 0653)  Intake/Output from previous day: 06/15 0701 - 06/16 0700 In: 5568 [P.O.:240; I.V.:4118] Out: 8938 [Urine:670; Drains:150; Blood:900] Intake/Output this shift: Total I/O In: -  Out: 390 [Urine:300; Drains:90]   Physical Exam  Constitutional: He is oriented to person, place, and time and well-developed, well-nourished, and in no distress.  Cardiovascular:  IRR with rapid rate  Pulmonary/Chest: Effort normal and breath sounds normal. No respiratory distress.  Abdominal: Soft. Bowel sounds are normal. There is tenderness (mild diffuse).  Wounds intact.  JP in LLQ.  Dressing dry.   Genitourinary:  Genitourinary Comments: Foley draining clear urine.   Musculoskeletal: Normal range of motion. He exhibits no edema or tenderness.  Neurological: He is alert and oriented to person, place, and time.  Skin: Skin is  warm and dry.  Psychiatric: Mood and affect normal.  Vitals reviewed.   Lab Results:   Recent Labs  07/01/16 1450 07/02/16 0255  WBC 13.6* 12.4*  HGB 11.4* 11.0*  HCT 33.9* 32.5*  PLT 198 191   BMET  Recent Labs  07/02/16 0255  NA 136  K 4.0  CL 107  CO2 24  GLUCOSE 130*  BUN 24*  CREATININE 1.63*  CALCIUM 8.2*   PT/INR No results for input(s): LABPROT, INR in the last 72 hours. ABG No results for input(s): PHART, HCO3 in the last 72 hours.  Invalid input(s): PCO2, PO2  Studies/Results: No results found.  Lab results reviewed.  Cardiology consult reviewed.   Assessment and Plan: Prostate cancer now POD1 from RALP with 973ml blood loss.   He is doing well with a mild anemia.  Urine is clear but he has significant JP output.  Cr is pending on drain fluid.   I will keep one more day and recheck his H&H in am. JP will be removed if JP fluid creatinine is low.   Diet will be advanced.   New onset Afib.   He has no associated symptoms and cardiology has been consulted.  CRI with a stable Cr.       LOS: 1 day    Daniel Jackson 07/02/2016 [phone] ID: Daniel Jackson, male   DOB: February 09, 1955, 61 y.o.   MRN: [id number]

## 2016-07-02 NOTE — Progress Notes (Signed)
[facility]  SUBJECTIVE: No cardiac complaints. Remains in atrial fibrillation with controlled ventricular response.   Vitals:   07/01/16 1627 07/01/16 1916 07/01/16 2102 07/02/16 0653  BP: (!) 114/91 (!) 112/49 124/82 99/67  Pulse: 95 67 (!) 112 (!) 102  Resp: 20 20 16 16   Temp: 98 F (36.7 C) 98.2 F (36.8 C) 98.6 F (37 C) 97.6 F (36.4 C)  TempSrc: Oral Oral Oral Oral  SpO2: 98% 99% 99% 95%  Weight:      Height:        Intake/Output Summary (Last 24 hours) at 07/02/16 1015 Last data filed at 07/02/16 0941  Gross per 24 hour  Intake             5558 ml  Output             2160 ml  Net             3398 ml    LABS: Basic Metabolic Panel:  Recent Labs  07/02/16 0255  NA 136  K 4.0  CL 107  CO2 24  GLUCOSE 130*  BUN 24*  CREATININE 1.63*  CALCIUM 8.2*   Liver Function Tests: No results for input(s): AST, ALT, ALKPHOS, BILITOT, PROT, ALBUMIN in the last 72 hours. No results for input(s): LIPASE, AMYLASE in the last 72 hours. CBC:  Recent Labs  07/01/16 1450 07/02/16 0255  WBC 13.6* 12.4*  HGB 11.4* 11.0*  HCT 33.9* 32.5*  MCV 88.4 87.3  PLT 198 191   Cardiac Enzymes:  Recent Labs  07/01/16 1450 07/01/16 2053 07/02/16 0255  TROPONINI <0.03 <0.03 <0.03   BNP: Invalid input(s): POCBNP D-Dimer: No results for input(s): DDIMER in the last 72 hours. Hemoglobin A1C: No results for input(s): HGBA1C in the last 72 hours. Fasting Lipid Panel: No results for input(s): CHOL, HDL, LDLCALC, TRIG, CHOLHDL, LDLDIRECT in the last 72 hours. Thyroid Function Tests: No results for input(s): TSH, T4TOTAL, T3FREE, THYROIDAB in the last 72 hours.  Invalid input(s): FREET3 Anemia Panel: No results for input(s): VITAMINB12, FOLATE, FERRITIN, TIBC, IRON, RETICCTPCT in the last 72 hours.   Physical Exam: Blood pressure 99/67, pulse (!) 102, temperature 97.6 F (36.4 C), temperature source Oral, resp. rate  16, height 5\' 9"  (1.753 m), weight 106.6 kg (235 lb), SpO2 95 %.   Wt Readings from Last 1 Encounters:  07/01/16 106.6 kg (235 lb)     General appearance: alert and cooperative Resp: clear to auscultation bilaterally Cardio: irregularly irregular rhythm Extremities: extremities normal, atraumatic, no cyanosis or edema Neurologic: Grossly normal  TELEMETRY: Reviewed telemetry pt in atrial fibrillation with controlled ventricular response:  ASSESSMENT AND PLAN:  Active Problems:   Prostate cancer (HCC)-status post radical prostatectomy per urology. Postop care per urology.  Atrial fibrillation-has apparent new onset atrial fibrillation perioperatively. Rate is well controlled. His chads 2 VASc score is 1 for hypertension. Given recent surgical intervention with a fairly high volume blood loss will remain off of anticoagulation for now. When acceptable from a surgical standpoint would consider chronic anticoagulation followed by attempt at cardioversion if he remains in atrial fibrillation. The atrial fibrillation is not related to ischemia serum troponin was normal. There is no ischemia and good rate control so therefore would remain on current medical regimen and avoid anticoagulation telemetry acceptable from a postoperative standpoint. Patient is acceptable for discharge from a cardiac standpoint at present when ready from a surgical standpoint.  Teodoro Spray, MD, [facility] 07/02/2016 10:15 AM

## 2016-07-03 ENCOUNTER — Encounter: Payer: Self-pay | Admitting: Urology

## 2016-07-03 DIAGNOSIS — D62 Acute posthemorrhagic anemia: Secondary | ICD-10-CM | POA: Diagnosis not present

## 2016-07-03 LAB — HEMOGLOBIN AND HEMATOCRIT, BLOOD
HCT: 31.2 % — ABNORMAL LOW (ref 40.0–52.0)
Hemoglobin: 10.6 g/dL — ABNORMAL LOW (ref 13.0–18.0)

## 2016-07-03 LAB — CREATININE, FLUID (PLEURAL, PERITONEAL, JP DRAINAGE): CREAT FL: 1.6 mg/dL
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
## 2016-07-03 NOTE — Progress Notes (Signed)
Discharge order received. Patient is alert and oriented. Vital signs stable . No signs of acute distress. Discharge instructions given including foley care and JP drain care. Patient and wife verbalized understanding. No other issues noted at this time.

## 2016-07-05 ENCOUNTER — Telehealth: Payer: Self-pay

## 2016-07-05 ENCOUNTER — Other Ambulatory Visit: Payer: Self-pay | Admitting: Anatomic Pathology & Clinical Pathology

## 2016-07-05 LAB — SURGICAL PATHOLOGY

## 2016-07-05 NOTE — Anesthesia Postprocedure Evaluation (Signed)
Anesthesia Post Note  Patient: Leon Montoya  Procedure(s) Performed: Procedure(s) (LRB): ROBOTIC ASSISTED LAPAROSCOPIC  PROSTATECTOMY (N/A) PELVIC LYMPH NODE DISSECTION (N/A)  Patient location during evaluation: PACU Anesthesia Type: General Level of consciousness: awake and alert and oriented Pain management: pain level controlled Vital Signs Assessment: post-procedure vital signs reviewed and stable Respiratory status: spontaneous breathing Cardiovascular status: blood pressure returned to baseline Anesthetic complications: no     Last Vitals:  Vitals:   07/03/16 0521 07/03/16 1227  BP: 120/84 (!) 146/99  Pulse: 85 (!) 108  Resp: 18   Temp: 36.6 C     Last Pain:  Vitals:   07/03/16 0521  TempSrc: Oral  PainSc:                  Heavenly Christine

## 2016-07-05 NOTE — Telephone Encounter (Signed)
Patient's wife called stating that the JP drain is still draing and out-put volumes Yesterday 70-134ml Today  AM- 20-36ml  They were told to call with volumes to see when drain could be removed. Please advise, thanks

## 2016-07-05 NOTE — Telephone Encounter (Signed)
done

## 2016-07-06 NOTE — Telephone Encounter (Signed)
Left pt mess to call/SW 

## 2016-07-07 ENCOUNTER — Ambulatory Visit: Payer: PRIVATE HEALTH INSURANCE

## 2016-07-07 DIAGNOSIS — C61 Malignant neoplasm of prostate: Secondary | ICD-10-CM

## 2016-07-07 NOTE — Telephone Encounter (Signed)
Spoke with patient and he will come in today for J-P fluid creatinin

## 2016-07-08 ENCOUNTER — Ambulatory Visit (INDEPENDENT_AMBULATORY_CARE_PROVIDER_SITE_OTHER): Payer: PRIVATE HEALTH INSURANCE | Admitting: Family Medicine

## 2016-07-08 DIAGNOSIS — C61 Malignant neoplasm of prostate: Secondary | ICD-10-CM

## 2016-07-08 LAB — SPECIMEN STATUS

## 2016-07-08 LAB — CREATININE, SERUM

## 2016-07-08 NOTE — Progress Notes (Signed)
Pt came into the office to have his JP drain removed. Daniel Jackson, CMA and Dr. [name] removed JP drain. No complications. Patient will f/u in the office next week.

## 2016-07-11 LAB — SPECIMEN STATUS REPORT

## 2016-07-14 ENCOUNTER — Encounter: Payer: Self-pay | Admitting: Urology

## 2016-07-14 ENCOUNTER — Ambulatory Visit (INDEPENDENT_AMBULATORY_CARE_PROVIDER_SITE_OTHER): Payer: PRIVATE HEALTH INSURANCE | Admitting: Urology

## 2016-07-14 VITALS — BP 113/74 | HR 75 | Ht 69.0 in | Wt 222.3 lb

## 2016-07-14 DIAGNOSIS — C61 Malignant neoplasm of prostate: Secondary | ICD-10-CM
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
## 2016-07-25 DIAGNOSIS — I480 Paroxysmal atrial fibrillation: Secondary | ICD-10-CM | POA: Insufficient documentation

## 2016-07-29 ENCOUNTER — Telehealth: Payer: Self-pay | Admitting: Urology

## 2016-07-29 NOTE — Telephone Encounter (Signed)
Please advise 

## 2016-07-29 NOTE — Telephone Encounter (Signed)
Unable to leave message. Please call patient and notify him to pick up work note up front.

## 2016-07-29 NOTE — Telephone Encounter (Signed)
Pt called today and wants to know if it's O.K. For him to go back to work.  If so, he will need a work note from our office.  Please call pt and let him know.  He can pick up this afternoon.

## 2016-08-01 NOTE — Telephone Encounter (Signed)
Tried to call patient, no answer, no voicemail.

## 2016-08-02 ENCOUNTER — Encounter: Payer: Self-pay | Admitting: Urology

## 2016-08-02 NOTE — Telephone Encounter (Signed)
Paent notified and will come get work note.

## 2016-08-17 DIAGNOSIS — I219 Acute myocardial infarction, unspecified: Secondary | ICD-10-CM

## 2016-08-17 HISTORY — DX: Acute myocardial infarction, unspecified: I21.9

## 2016-08-28 DIAGNOSIS — I213 ST elevation (STEMI) myocardial infarction of unspecified site: Secondary | ICD-10-CM | POA: Insufficient documentation

## 2016-08-28 DIAGNOSIS — E785 Hyperlipidemia, unspecified: Secondary | ICD-10-CM | POA: Insufficient documentation

## 2016-08-28 DIAGNOSIS — I2510 Atherosclerotic heart disease of native coronary artery without angina pectoris: Secondary | ICD-10-CM | POA: Insufficient documentation

## 2016-08-30 ENCOUNTER — Encounter: Admission: RE | Payer: Self-pay | Source: Ambulatory Visit

## 2016-08-30 ENCOUNTER — Ambulatory Visit: Admission: RE | Admit: 2016-08-30 | Payer: 59 | Source: Ambulatory Visit | Admitting: Cardiology

## 2016-08-30 SURGERY — LEFT HEART CATH AND CORONARY ANGIOGRAPHY
Anesthesia: Moderate Sedation | Laterality: Left

## 2016-10-07 ENCOUNTER — Other Ambulatory Visit: Payer: Self-pay

## 2016-10-07 DIAGNOSIS — C61 Malignant neoplasm of prostate: Secondary | ICD-10-CM

## 2016-10-10 ENCOUNTER — Encounter: Payer: Self-pay | Admitting: Urology

## 2016-10-10 ENCOUNTER — Other Ambulatory Visit: Payer: PRIVATE HEALTH INSURANCE

## 2016-10-11 ENCOUNTER — Other Ambulatory Visit: Payer: PRIVATE HEALTH INSURANCE

## 2016-10-11 DIAGNOSIS — C61 Malignant neoplasm of prostate: Secondary | ICD-10-CM

## 2016-10-12 LAB — PSA

## 2016-10-13 ENCOUNTER — Encounter: Payer: Self-pay | Admitting: Urology

## 2016-10-13 ENCOUNTER — Ambulatory Visit (INDEPENDENT_AMBULATORY_CARE_PROVIDER_SITE_OTHER): Payer: PRIVATE HEALTH INSURANCE | Admitting: Urology

## 2016-10-13 VITALS — BP 132/89 | HR 97 | Ht 69.0 in | Wt 228.0 lb

## 2016-10-13 DIAGNOSIS — C61 Malignant neoplasm of prostate: Secondary | ICD-10-CM
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
## 2016-12-21 ENCOUNTER — Other Ambulatory Visit: Payer: Self-pay

## 2016-12-21 DIAGNOSIS — C61 Malignant neoplasm of prostate: Secondary | ICD-10-CM

## 2017-01-06 ENCOUNTER — Other Ambulatory Visit: Payer: PRIVATE HEALTH INSURANCE

## 2017-01-12 ENCOUNTER — Ambulatory Visit: Payer: PRIVATE HEALTH INSURANCE

## 2017-01-20 ENCOUNTER — Ambulatory Visit: Payer: PRIVATE HEALTH INSURANCE | Admitting: Urology

## 2017-01-28 DIAGNOSIS — E559 Vitamin D deficiency, unspecified: Secondary | ICD-10-CM | POA: Insufficient documentation
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
## 2017-02-09 ENCOUNTER — Encounter (INDEPENDENT_AMBULATORY_CARE_PROVIDER_SITE_OTHER): Payer: Self-pay | Admitting: Vascular Surgery

## 2017-02-09 ENCOUNTER — Ambulatory Visit (INDEPENDENT_AMBULATORY_CARE_PROVIDER_SITE_OTHER): Payer: 59 | Admitting: Vascular Surgery

## 2017-02-09 ENCOUNTER — Other Ambulatory Visit (INDEPENDENT_AMBULATORY_CARE_PROVIDER_SITE_OTHER): Payer: Self-pay | Admitting: Vascular Surgery

## 2017-02-09 VITALS — BP 138/87 | HR 51 | Resp 17 | Ht 69.0 in | Wt 227.0 lb

## 2017-02-09 DIAGNOSIS — K219 Gastro-esophageal reflux disease without esophagitis: Secondary | ICD-10-CM | POA: Diagnosis not present

## 2017-02-09 DIAGNOSIS — R2231 Localized swelling, mass and lump, right upper limb: Secondary | ICD-10-CM | POA: Diagnosis not present

## 2017-02-09 DIAGNOSIS — I721 Aneurysm of artery of upper extremity: Secondary | ICD-10-CM

## 2017-02-09 DIAGNOSIS — I25118 Atherosclerotic heart disease of native coronary artery with other forms of angina pectoris: Secondary | ICD-10-CM

## 2017-02-09 DIAGNOSIS — I10 Essential (primary) hypertension: Secondary | ICD-10-CM | POA: Diagnosis not present

## 2017-02-10 ENCOUNTER — Ambulatory Visit (INDEPENDENT_AMBULATORY_CARE_PROVIDER_SITE_OTHER): Payer: 59

## 2017-02-10 DIAGNOSIS — I721 Aneurysm of artery of upper extremity: Secondary | ICD-10-CM | POA: Diagnosis not present

## 2017-02-12 ENCOUNTER — Encounter (INDEPENDENT_AMBULATORY_CARE_PROVIDER_SITE_OTHER): Payer: Self-pay | Admitting: Vascular Surgery

## 2017-02-12 DIAGNOSIS — R2231 Localized swelling, mass and lump, right upper limb: Secondary | ICD-10-CM | POA: Insufficient documentation

## 2017-02-13 ENCOUNTER — Ambulatory Visit (INDEPENDENT_AMBULATORY_CARE_PROVIDER_SITE_OTHER): Payer: 59 | Admitting: Vascular Surgery

## 2017-02-13 ENCOUNTER — Encounter (INDEPENDENT_AMBULATORY_CARE_PROVIDER_SITE_OTHER): Payer: Self-pay | Admitting: Vascular Surgery

## 2017-02-13 VITALS — BP 146/85 | HR 77 | Resp 16 | Ht 71.0 in | Wt 228.0 lb

## 2017-02-13 DIAGNOSIS — K219 Gastro-esophageal reflux disease without esophagitis: Secondary | ICD-10-CM

## 2017-02-13 DIAGNOSIS — I25118 Atherosclerotic heart disease of native coronary artery with other forms of angina pectoris: Secondary | ICD-10-CM

## 2017-02-13 DIAGNOSIS — R2231 Localized swelling, mass and lump, right upper limb: Secondary | ICD-10-CM

## 2017-02-13 DIAGNOSIS — I10 Essential (primary) hypertension: Secondary | ICD-10-CM

## 2017-02-13 DIAGNOSIS — I721 Aneurysm of artery of upper extremity: Secondary | ICD-10-CM

## 2017-02-16 ENCOUNTER — Ambulatory Visit (INDEPENDENT_AMBULATORY_CARE_PROVIDER_SITE_OTHER): Payer: 59 | Admitting: Urology

## 2017-02-16 ENCOUNTER — Encounter: Payer: Self-pay | Admitting: Urology

## 2017-02-16 VITALS — BP 144/81 | HR 53 | Ht 71.0 in | Wt 231.9 lb

## 2017-02-16 DIAGNOSIS — C61 Malignant neoplasm of prostate: Secondary | ICD-10-CM | POA: Diagnosis not present

## 2017-02-16 DIAGNOSIS — N3281 Overactive bladder: Secondary | ICD-10-CM | POA: Diagnosis not present

## 2017-02-16 LAB — BLADDER SCAN AMB NON-IMAGING: SCAN RESULT: 0

## 2017-02-16 MED ORDER — MIRABEGRON ER 25 MG PO TB24: 25.0000 mg | ORAL_TABLET | Freq: Every day | ORAL | 11 refills | Status: DC
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
## 2017-02-17 LAB — PSA: Prostate Specific Ag, Serum: <0.1 ng/mL (ref 0.0–4.0)

## 2017-02-19 ENCOUNTER — Encounter (INDEPENDENT_AMBULATORY_CARE_PROVIDER_SITE_OTHER): Payer: Self-pay | Admitting: Vascular Surgery

## 2017-02-19 DIAGNOSIS — I721 Aneurysm of artery of upper extremity: Secondary | ICD-10-CM | POA: Insufficient documentation
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
## 2017-05-12 ENCOUNTER — Ambulatory Visit: Payer: PRIVATE HEALTH INSURANCE

## 2017-05-25 ENCOUNTER — Other Ambulatory Visit: Payer: Self-pay | Admitting: Neurology

## 2017-06-02 ENCOUNTER — Ambulatory Visit (INDEPENDENT_AMBULATORY_CARE_PROVIDER_SITE_OTHER): Payer: 59 | Admitting: Urology

## 2017-06-02 VITALS — BP 129/74 | HR 63 | Ht 69.0 in | Wt 226.0 lb

## 2017-06-02 DIAGNOSIS — N3281 Overactive bladder: Secondary | ICD-10-CM | POA: Diagnosis not present

## 2017-06-02 DIAGNOSIS — C61 Malignant neoplasm of prostate: Secondary | ICD-10-CM | POA: Diagnosis not present

## 2017-06-02 MED ORDER — MIRABEGRON ER 25 MG PO TB24: 25.0000 mg | ORAL_TABLET | Freq: Every day | ORAL | 11 refills | Status: DC
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
## 2017-06-03 LAB — PSA: Prostate Specific Ag, Serum: <0.1 ng/mL (ref 0.0–4.0)

## 2017-06-05 ENCOUNTER — Telehealth: Payer: Self-pay

## 2017-06-05 ENCOUNTER — Encounter: Payer: Self-pay | Admitting: Adult Health

## 2017-06-05 ENCOUNTER — Ambulatory Visit (INDEPENDENT_AMBULATORY_CARE_PROVIDER_SITE_OTHER): Payer: 59 | Admitting: Adult Health

## 2017-06-05 VITALS — BP 123/74 | HR 81 | Ht 69.0 in | Wt 228.6 lb

## 2017-06-05 DIAGNOSIS — F32A Depression, unspecified: Secondary | ICD-10-CM

## 2017-06-05 DIAGNOSIS — Z87820 Personal history of traumatic brain injury: Secondary | ICD-10-CM

## 2017-06-05 DIAGNOSIS — F329 Major depressive disorder, single episode, unspecified: Secondary | ICD-10-CM | POA: Diagnosis not present

## 2017-06-05 DIAGNOSIS — R413 Other amnesia: Secondary | ICD-10-CM

## 2017-06-05 MED ORDER — SERTRALINE HCL 100 MG PO TABS: 100.0000 mg | ORAL_TABLET | Freq: Every day | ORAL | 3 refills | Status: AC

## 2017-06-05 NOTE — Patient Instructions (Signed)
Your Plan:  Continue zoloft  If your symptoms worsen or you develop new symptoms please let us know.   Thank you for coming to see [place] at [facility]. I hope we have been able to provide you high quality care today.  You may receive a patient satisfaction survey over the next few weeks. We would appreciate your feedback and comments so that we may continue to improve ourselves and the health of our patients.

## 2017-06-05 NOTE — Telephone Encounter (Signed)
No answer-- unable to leave message.
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
## 2017-06-05 NOTE — Telephone Encounter (Signed)
-----   Message from Nickie Retort, MD sent at 06/05/2017  9:35 AM EDT ----- Please let patient know PSA remains undetectable. No sign of cancer.  Thanks   ----- Message ----- From: Royanne Foots, CMA Sent: 06/05/2017   8:31 AM To: Nickie Retort, MD    ----- Message ----- From: Interface, [facility] Lab Results In Sent: 06/03/2017   5:40 AM To: Rowe Robert Clinical

## 2017-06-05 NOTE — Progress Notes (Signed)
I have read the note, and I agree with the clinical assessment and plan.  Randell Teare K Charnee Turnipseed   

## 2017-11-24 ENCOUNTER — Encounter: Payer: Self-pay | Admitting: Urology

## 2017-11-24 ENCOUNTER — Other Ambulatory Visit: Payer: 59

## 2017-12-01 ENCOUNTER — Ambulatory Visit: Payer: 59

## 2017-12-04 ENCOUNTER — Ambulatory Visit: Payer: 59 | Admitting: Urology

## 2017-12-25 ENCOUNTER — Ambulatory Visit: Payer: 59 | Admitting: Urology

## 2018-01-30 DIAGNOSIS — F4312 Post-traumatic stress disorder, chronic: Secondary | ICD-10-CM | POA: Insufficient documentation

## 2018-02-05 ENCOUNTER — Ambulatory Visit (INDEPENDENT_AMBULATORY_CARE_PROVIDER_SITE_OTHER): Payer: 59 | Admitting: Urology

## 2018-02-05 ENCOUNTER — Encounter: Payer: Self-pay | Admitting: Urology

## 2018-02-05 VITALS — BP 139/84 | HR 54 | Ht 69.0 in | Wt 217.0 lb

## 2018-02-05 DIAGNOSIS — C61 Malignant neoplasm of prostate: Secondary | ICD-10-CM | POA: Diagnosis not present

## 2018-02-05 MED ORDER — TADALAFIL 20 MG PO TABS: 20.0000 mg | ORAL_TABLET | Freq: Every day | ORAL | 11 refills | Status: AC | PRN

## 2018-02-05 NOTE — Progress Notes (Signed)
   02/05/2018 10:10 AM   Junius Roads Jan 29, 1955 [id number]  Reason for visit: Follow up prostate cancer s/p prostatectomy  HPI: I saw Mr. Gutman for the first time in urology clinic today for follow-up of prostate cancer.  He was previously a patient of Dr. [name].  He is a 63 year old African-American male who underwent a robotic prostatectomy in June 2018 with Dr. [name] with final pathology showing Gleason 3+4 = 7 pT2N0Mxdisease, with more than 50% of the gland involved, and a positive margin at the left apex.  His post-op course was complicated by myocardial infarction requiring stent placement at [facility]. His PSA has remained undetectable.  His PSA today is pending.   He has had mild to moderate stress incontinence postoperatively.  He did have some urgency and urge incontinence previously managed on Myrbetriq, but he has since stopped this medication.  He has not had any erections since surgery despite trial of 10 mg Cialis on demand.  There are no aggravating or alleviating factors.  Severity is moderate.  He denies any bone pain, gross hematuria, or weight loss.  He no longer is taking any nitrates for chest pain.  He is interested in treatment for his erectile dysfunction.  Dr. [name] had previously discussed possible adjuvant radiation when his urinary symptoms have improved.  ROS: Please see flowsheet from today's date for complete review of systems.  Physical Exam: BP 139/84 (BP Location: Left Arm, Patient Position: Sitting)   Pulse (!) 54   Ht 5\' 9"  (1.753 m)   Wt 217 lb (98.4 kg)   BMI 32.05 kg/m    Constitutional:  Alert and oriented, No acute distress. Respiratory: Normal respiratory effort, no increased work of breathing. GI: Abdomen is soft, nontender, nondistended, no abdominal masses GU: No CVA tenderness Skin: No rashes, bruises or suspicious lesions. Neurologic: Grossly intact, no focal deficits, moving all 4 extremities. Psychiatric: Normal mood and  affect  Laboratory Data: PSA today pending PSA has been undetectable since surgery, 8.8 preop  Pertinent Imaging: None to review  Assessment & Plan:   In summary, the patient is a 63 year old African-American male with history of robotic prostatectomy in June 2018 by Dr. [name] with final pathology showing high volume intermediate risk disease, with a positive margin at the apex.  PSA today is pending.  He has ongoing erectile dysfunction and he is interested in other strategies for treatment today, including penile injections.  He has mild stress incontinence, that is minimally bothersome.  -RTC 2 weeks with Zara Council, PA for penile injection teaching -Trial of Cialis 20 mg on demand.  We discussed the risks and benefits, and cannot take this at the same time as nitrates. -We will contact with PSA results, if remains undetectable RTC 6 months with Laureles, MD  [place] [address] [place], [place] [address] [phone]

## 2018-02-06 ENCOUNTER — Telehealth: Payer: Self-pay

## 2018-02-06 DIAGNOSIS — R972 Elevated prostate specific antigen [PSA]: Secondary | ICD-10-CM

## 2018-02-06 LAB — PSA: Prostate Specific Ag, Serum: <0.1 ng/mL (ref 0.0–4.0)

## 2018-02-06 NOTE — Telephone Encounter (Signed)
Called and advised patient of PSA results and Dr Willene Hatchet note. Patient verbalized understanding and will follow up with Larene Beach in 2 weeks and Dr Diamantina Providence in 6 months.

## 2018-02-06 NOTE — Telephone Encounter (Signed)
-----   Message from Billey Co, MD sent at 02/06/2018  8:59 AM EST ----- His PSA remains undetectable, this is great news! Keep follow-up in 2 weeks with Larene Beach to discuss penile injections. Schedule follow-up with me in 6 months with PSA prior.

## 2018-02-07 ENCOUNTER — Telehealth: Payer: Self-pay | Admitting: Urology

## 2018-02-07 NOTE — Telephone Encounter (Signed)
-----   Message from Billey Co, MD sent at 02/06/2018  8:59 AM EST ----- His PSA remains undetectable, this is great news! Keep follow-up in 2 weeks with Larene Beach to discuss penile injections. Schedule follow-up with me in 6 months with PSA prior.

## 2018-02-07 NOTE — Telephone Encounter (Signed)
Follow up app made   [facility]

## 2018-02-22 NOTE — Progress Notes (Incomplete)
° °  02/23/2018 8:09 AM  Junius Roads Jun 09, 1955 [id number]   Referring provider: Ezequiel Kayser, MD [facility] [place], [place] [address]   No chief complaint on file.   HPI: Josiel Gahm is a 63 y.o. male African American who has mild incontinence, prostate cancer, and erectile dysfunction, presenting today for penile injection training.  Patient has been experiencing erectile dysfunction since his robotic prostatectomy in 06/2016, with his post-op course complicated by a myocardial infarction requiring stent placement.  He has had mild to moderate stress incontinence postoperatively, with some urgency and urge incontience previously managed on Myrbetriq.  He has also not had any erections since surgery, despite a trial of 10 mg Cialis on demand.  He has opted to try penile injections.  ***  Physical Exam: {EDIT PHYSICAL AS APPROPRIATE} There were no vitals taken for this visit.  Constitutional:  Well nourished. Alert and oriented, No acute distress. {HEENT: [place] AT, moist mucus membranes.  Trachea midline, no masses.} Cardiovascular: No clubbing, cyanosis, or edema. Respiratory: Normal respiratory effort, no increased work of breathing. {GI: Abdomen is soft, non tender, non distended, no abdominal masses. Liver and spleen not palpable.  No hernias appreciated.  Stool sample for occult testing is not indicated.} {GU: No CVA tenderness.  No bladder fullness or masses.  Patient with circumcised/uncircumcised phallus. ***Foreskin easily retracted***  Urethral meatus is patent.  No penile discharge. No penile lesions or rashes. Scrotum without lesions, cysts, rashes and/or edema.  Testicles are located scrotally bilaterally. No masses are appreciated in the testicles. Left and right epididymis are normal.} {Rectal: Patient with normal sphincter tone. Anus and perineum without scarring or rashes. No rectal masses are appreciated. Prostate is approximately *** grams,  *** nodules are appreciated. Seminal vesicles are normal.} Skin: No rashes, bruises or suspicious lesions. {Lymph: No cervical or inguinal adenopathy.} Neurologic: Grossly intact, no focal deficits, moving all 4 extremities. Psychiatric: Normal mood and affect.  Procedure *** Patient's left corpus cavernosum is identified.  An area near the base of the penis is cleansed with rubbing alcohol.  Careful to avoid the dorsal vein, *** mcg of Trimix (papaverine 30 mg, phentolamine 1 mg and prostaglandin E1 10 mcg, Lot # ***@*** exp # *** is injected at a 90 degree angle into the left *** corpus cavernosum near the base of the penis.  Patient experienced a very firm erection in 15 minutes.    Assessment & Plan:    1. Erectile Dysfunction - Patient has had erectile dysfunction since robotic prostatectomy in 06/2016 - Patient has tried Cialis 10 mg with no results{; Cialis 20 mg was also unsuccessful} - Patient will ***  2. Prostate Cancer - s/p robotic prostatectomy 06/2016; final pathology showed high volume intermediate risk disease with positive margin at apex  3. Stress Incontinence - Currently mild and minimally bothersome   No follow-ups on file.  Laneta Simmers  [facility] [address] [place], [place] [address] [phone]  I, Adele Schilder, am acting as a Education administrator for [employer], PA-C.   {Add Scribe Attestation Statement}

## 2018-02-23 ENCOUNTER — Ambulatory Visit: Payer: 59 | Admitting: Urology

## 2018-08-01 DIAGNOSIS — F6381 Intermittent explosive disorder: Secondary | ICD-10-CM | POA: Insufficient documentation

## 2018-08-06 ENCOUNTER — Other Ambulatory Visit: Payer: 59

## 2018-08-07 ENCOUNTER — Other Ambulatory Visit: Payer: Self-pay

## 2018-08-07 ENCOUNTER — Other Ambulatory Visit: Payer: 59

## 2018-08-07 DIAGNOSIS — R972 Elevated prostate specific antigen [PSA]: Secondary | ICD-10-CM

## 2018-08-08 LAB — PSA: Prostate Specific Ag, Serum: <0.1 ng/mL (ref 0.0–4.0)

## 2018-08-09 ENCOUNTER — Ambulatory Visit (INDEPENDENT_AMBULATORY_CARE_PROVIDER_SITE_OTHER): Payer: 59 | Admitting: Urology

## 2018-08-09 ENCOUNTER — Other Ambulatory Visit: Payer: Self-pay

## 2018-08-09 ENCOUNTER — Encounter: Payer: Self-pay | Admitting: Urology

## 2018-08-09 VITALS — BP 99/71 | HR 61 | Ht 69.0 in | Wt 226.0 lb

## 2018-08-09 DIAGNOSIS — Z8546 Personal history of malignant neoplasm of prostate: Secondary | ICD-10-CM | POA: Diagnosis not present

## 2018-08-09 MED ORDER — NONFORMULARY OR COMPOUNDED ITEM: 0 refills | Status: AC

## 2018-08-09 NOTE — Progress Notes (Signed)
   08/09/2018 2:46 PM   Tennis Mckinnon 1955-11-01 [id number]  Reason for visit: Follow up prostate cancer s/p RALP 06/2016   HPI: I saw Mr. Deem in urology clinic today for follow-up of prostate cancer.  He was previously a patient of Dr. [name].  He is a 63 year old African-American male who underwent a robotic prostatectomy in June 2018 with Dr. [name] with final pathology showing Gleason 3+4=7, pT2N0Mxprostate adenocarcinoma, with more than 50% of the gland involved, and a positive margin at the left apex.  His post-op course was complicated by myocardial infarction requiring stent placement at [facility]. His PSA has been undetectable post-op.  His most recent PSA 08/07/18 remains undetectable.   He reports erectile dysfunction since surgery.  He has tried 20 mg Cialis without improvement.  He is interested in trying penile injections.  He also has mild stress urinary incontinence that is minimally bothersome.  He does not wear any pads.  He occasionally has some nocturia 0-1 times per night, but this also is not particularly bothersome.  Overall, he feels he is doing quite well.   ROS: Please see flowsheet from today's date for complete review of systems.  Physical Exam: BP 99/71   Pulse 61   Ht 5\' 9"  (1.753 m)   Wt 226 lb (102.5 kg)   BMI 33.37 kg/m    Laboratory Data: PSA 08/07/2018 undetectable  Assessment & Plan:   In summary, the patient is a 63 year old African-American male with history of robotic prostatectomy in June 2018 by Dr. [name] with final pathology showing high volume intermediate risk disease, with a positive margin at the apex.  PSA today remains undetectable.  He has ongoing erectile dysfunction and he is interested in other strategies for treatment today, including penile injections.  He has mild stress incontinence, that is minimally bothersome.  Test dose trimix prescribed, RTC w/Shannon McGowan, PA in ~2 weeks for teaching RTC 6 months for repeat PSA  surveillance  A total of 15 minutes were spent face-to-face with the patient, greater than 50% was spent in patient education, counseling, and coordination of care regarding prostate cancer surveillance, erectile dysfunction, and stress urinary incontinence.   Billey Co, [facility] [address] [place], [place] [address] [phone]

## 2018-09-03 NOTE — Progress Notes (Signed)
Daniel Jackson presents today for a Trimix titration.  He is no longer spontaneous erections.    He has had mild response to PDE5i's.  He denies any history of sickle cell anemia or trait, a history of multiple myeloma or a history of leukemia.  He has not taken trazodone or a PDE5i's today.    Patient's left corpus cavernosum is identified.  An area near the base of the penis is cleansed with rubbing alcohol.  Careful to avoid the dorsal vein, 3 mcg of Trimix (papaverine 30 mg, phentolamine 1 mg and prostaglandin E1 10 mcg, Lot # [id number]  exp # 10/07/2018) is injected at a 90 degree angle into the left corpus cavernosum near the base of the penis.  Patient experienced a semi-firm erection in 15 minutes.    Patient stated that he had to return to work and did not want to have another injection this morning as he did not want to risk a priapism.    He stated he does feel comfortable with the injection and will inject 4 mcg on Thursday morning (09/06/2018) and report to 09/19/2018 his result that afternoon.    Advised patient of the condition of priapism, painful erection lasting for more than four hours, and to contact the office immediately or seek treatment in the ED   Keep lab appointment on 02/12/2019 for PSA and office visit with Dr. 02/25/2019 on 02/14/2019

## 2018-09-04 ENCOUNTER — Ambulatory Visit (INDEPENDENT_AMBULATORY_CARE_PROVIDER_SITE_OTHER): Payer: 59 | Admitting: Urology

## 2018-09-04 ENCOUNTER — Other Ambulatory Visit: Payer: Self-pay

## 2018-09-04 ENCOUNTER — Encounter: Payer: Self-pay | Admitting: Urology

## 2018-09-04 VITALS — BP 132/88 | HR 57 | Ht 69.0 in | Wt 225.0 lb

## 2018-09-04 DIAGNOSIS — Z8546 Personal history of malignant neoplasm of prostate: Secondary | ICD-10-CM

## 2018-09-04 DIAGNOSIS — N529 Male erectile dysfunction, unspecified: Secondary | ICD-10-CM | POA: Diagnosis not present

## 2019-02-12 ENCOUNTER — Other Ambulatory Visit: Payer: 59

## 2019-02-14 ENCOUNTER — Ambulatory Visit: Payer: 59 | Admitting: Urology

## 2019-03-08 ENCOUNTER — Other Ambulatory Visit: Payer: 59

## 2019-03-08 ENCOUNTER — Other Ambulatory Visit: Payer: Self-pay

## 2019-03-08 DIAGNOSIS — Z8546 Personal history of malignant neoplasm of prostate: Secondary | ICD-10-CM

## 2019-03-09 LAB — PSA: Prostate Specific Ag, Serum: <0.1 ng/mL (ref 0.0–4.0)

## 2019-03-13 ENCOUNTER — Other Ambulatory Visit: Payer: Self-pay

## 2019-03-13 ENCOUNTER — Encounter: Payer: Self-pay | Admitting: Urology

## 2019-03-13 ENCOUNTER — Ambulatory Visit (INDEPENDENT_AMBULATORY_CARE_PROVIDER_SITE_OTHER): Payer: 59 | Admitting: Urology

## 2019-03-13 VITALS — BP 154/96 | HR 59 | Ht 69.0 in | Wt 227.0 lb

## 2019-03-13 DIAGNOSIS — N529 Male erectile dysfunction, unspecified: Secondary | ICD-10-CM | POA: Diagnosis not present

## 2019-03-13 DIAGNOSIS — C61 Malignant neoplasm of prostate: Secondary | ICD-10-CM

## 2019-03-13 NOTE — Progress Notes (Signed)
   03/13/2019 8:42 AM   Junius Roads [birth year] [id number]  Reason for visit: Follow up prostate cancer, ED  HPI: I saw Mr. Lingenfelter in urology clinic today for follow-up of prostate cancer. He was previously a patient of Dr. [name]. He is a 64 year old African-American male who underwent a robotic prostatectomy in June 2018 with Dr. Adrian Saran final pathology showing Gleason 3+4=7, pT2N0Mxprostate adenocarcinoma,with more than 50% of the gland involved, and a positive margin at the left apex. His post-op course was complicated by myocardial infarction requiring stent placement at [facility]. His PSA has been undetectable post-op. His most recent PSA 03/08/2019 remains undetectable.   He has mild stress incontinence when he coughs that is minimally bothersome.  He is not wearing a pad.  We discussed Kegel exercises as an option to help strengthen the pelvic floor and decrease his incontinence.  He also has erectile dysfunction that is refractory to PDE 5 inhibitors.  He has previously tried penile Trimix injections with Zara Council with good result in clinic, however he did not ever feel comfortable trying this at home.  He is interested in meeting with her again for a test dose and to review the injection protocol.  We discussed the risks and benefits of injection with his blood thinners for CAD.  In summary, he is a 64 year old male with history of intermediate risk prostate cancer treated with radical prostatectomy, with no evidence of PSA recurrence at this time.  We reviewed the risk of prostate cancer recurrence at length and the need for ongoing PSA monitoring.  He also has erectile dysfunction refractory to PDE 5 inhibitors, and he is interested in learning more about Trimix penile injections again.  -Follow-up with Zara Council in the next few weeks for test dose and penile injection teaching again -RTC 6 months with PSA prior, if PSA remains undetectable, consider spacing to  yearly [place], [place] [address], [place], [place] [address] [phone]

## 2019-04-12 ENCOUNTER — Encounter: Payer: Self-pay | Admitting: Urology

## 2019-04-12 ENCOUNTER — Ambulatory Visit: Payer: 59 | Admitting: Urology

## 2019-04-19 ENCOUNTER — Ambulatory Visit: Payer: 59

## 2019-04-19 ENCOUNTER — Ambulatory Visit: Payer: PRIVATE HEALTH INSURANCE | Attending: Internal Medicine

## 2019-04-19 DIAGNOSIS — Z23 Encounter for immunization: Secondary | ICD-10-CM

## 2019-04-19 NOTE — Progress Notes (Signed)
   Covid-19 Vaccination Clinic  Name:  Laker Josephsen    MRN: [id number] DOB: 1955/10/31  04/19/2019  Mr. Fipps was observed post Covid-19 immunization for 15 minutes without incident. He was provided with Vaccine Information Sheet and instruction to access the V-Safe system.   Mr. Slappy was instructed to call 911 with any severe reactions post vaccine: Marland Kitchen Difficulty breathing  . Swelling of face and throat  . A fast heartbeat  . A bad rash all over body  . Dizziness and weakness   Immunizations Administered    Name Date Dose VIS Date Route   Pfizer COVID-19 Vaccine 04/19/2019  8:24 AM 0.3 mL 12/28/2018 Intramuscular   Manufacturer: [place]   Lot: [id number]   [place]: [id number]

## 2019-05-15 ENCOUNTER — Ambulatory Visit: Attending: Internal Medicine

## 2019-05-15 DIAGNOSIS — Z23 Encounter for immunization: Secondary | ICD-10-CM

## 2019-05-15 NOTE — Progress Notes (Signed)
   Covid-19 Vaccination Clinic  Name:  Daniel Jackson    MRN: [id number] DOB: Jun 15, 1955  05/15/2019  [name] was observed post Covid-19 immunization for 15 minutes without incident. He was provided with Vaccine Information Sheet and instruction to access the V-Safe system.   [name] was instructed to call 911 with any severe reactions post vaccine: Marland Kitchen Difficulty breathing  . Swelling of face and throat  . A fast heartbeat  . A bad rash all over body  . Dizziness and weakness   Immunizations Administered    Name Date Dose VIS Date Route   Pfizer COVID-19 Vaccine 05/15/2019  8:29 AM 0.3 mL 03/13/2018 Intramuscular   Manufacturer: [place]   Lot: [id number]   [place]: [id number]

## 2019-07-22 ENCOUNTER — Emergency Department
Admission: EM | Admit: 2019-07-22 | Discharge: 2019-07-22 | Disposition: A | Discharge status: Home / Self Care | Payer: 59 | Attending: Emergency Medicine | Admitting: Emergency Medicine

## 2019-07-22 ENCOUNTER — Encounter: Payer: Self-pay | Admitting: Emergency Medicine

## 2019-07-22 ENCOUNTER — Other Ambulatory Visit: Payer: Self-pay

## 2019-07-22 DIAGNOSIS — N183 Chronic kidney disease, stage 3 unspecified: Secondary | ICD-10-CM | POA: Insufficient documentation

## 2019-07-22 DIAGNOSIS — R42 Dizziness and giddiness: Secondary | ICD-10-CM | POA: Insufficient documentation

## 2019-07-22 DIAGNOSIS — I2510 Atherosclerotic heart disease of native coronary artery without angina pectoris: Secondary | ICD-10-CM | POA: Insufficient documentation

## 2019-07-22 DIAGNOSIS — I129 Hypertensive chronic kidney disease with stage 1 through stage 4 chronic kidney disease, or unspecified chronic kidney disease: Secondary | ICD-10-CM | POA: Insufficient documentation

## 2019-07-22 LAB — BASIC METABOLIC PANEL
Anion gap: 11 (ref 5–15)
BUN: 30 mg/dL — ABNORMAL HIGH (ref 8–23)
CO2: 19 mmol/L — ABNORMAL LOW (ref 22–32)
Calcium: 9 mg/dL (ref 8.9–10.3)
Chloride: 107 mmol/L (ref 98–111)
Creatinine, Ser: 1.73 mg/dL — ABNORMAL HIGH (ref 0.61–1.24)
GFR calc Af Amer: 47 mL/min — ABNORMAL LOW (ref >60)
GFR calc non Af Amer: 41 mL/min — ABNORMAL LOW (ref >60)
Glucose, Bld: 101 mg/dL — ABNORMAL HIGH (ref 70–99)
Potassium: 3.9 mmol/L (ref 3.5–5.1)
Sodium: 137 mmol/L (ref 135–145)

## 2019-07-22 LAB — CBC
HCT: 42.5 % (ref 39.0–52.0)
Hemoglobin: 14.7 g/dL (ref 13.0–17.0)
MCH: 30.6 pg (ref 26.0–34.0)
MCHC: 34.6 g/dL (ref 30.0–36.0)
MCV: 88.5 fL (ref 80.0–100.0)
Platelets: 274 10*3/uL (ref 150–400)
RBC: 4.8 MIL/uL (ref 4.22–5.81)
RDW: 12.9 % (ref 11.5–15.5)
WBC: 6 10*3/uL (ref 4.0–10.5)
nRBC: 0 % (ref 0.0–0.2)

## 2019-07-22 MED ORDER — SODIUM CHLORIDE 0.9% FLUSH: 3.0000 mL | Freq: Once | INTRAVENOUS | Status: DC

## 2019-07-22 NOTE — ED Notes (Signed)
Pt left prior to receiving discharge paperwork. Pt was seen by doctor.

## 2019-07-22 NOTE — ED Triage Notes (Signed)
Pt reports having a fall yesterday after feeling dizzy.  Pt states ambulance came out and noted a low BP.  Pt states rested the rest of the day and was dizzy anytime he stood up.  States he was feeling better last night and today notes that the dizziness has returned.
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
## 2019-07-31 ENCOUNTER — Other Ambulatory Visit
Admission: RE | Admit: 2019-07-31 | Discharge: 2019-07-31 | Disposition: A | Discharge status: Home / Self Care | Payer: 59 | Source: Ambulatory Visit | Attending: Gastroenterology | Admitting: Gastroenterology

## 2019-07-31 ENCOUNTER — Other Ambulatory Visit: Payer: Self-pay

## 2019-07-31 DIAGNOSIS — Z01812 Encounter for preprocedural laboratory examination: Secondary | ICD-10-CM | POA: Diagnosis present

## 2019-07-31 DIAGNOSIS — Z20822 Contact with and (suspected) exposure to covid-19: Secondary | ICD-10-CM | POA: Insufficient documentation

## 2019-07-31 LAB — SARS CORONAVIRUS 2 (TAT 6-24 HRS): SARS Coronavirus 2: NEGATIVE

## 2019-08-01 ENCOUNTER — Encounter: Payer: Self-pay | Admitting: *Deleted

## 2019-08-07 ENCOUNTER — Other Ambulatory Visit
Admission: RE | Admit: 2019-08-07 | Discharge: 2019-08-07 | Disposition: A | Discharge status: Home / Self Care | Payer: 59 | Source: Ambulatory Visit | Attending: Gastroenterology | Admitting: Gastroenterology

## 2019-08-07 ENCOUNTER — Other Ambulatory Visit: Payer: Self-pay

## 2019-08-07 DIAGNOSIS — Z20822 Contact with and (suspected) exposure to covid-19: Secondary | ICD-10-CM | POA: Diagnosis not present

## 2019-08-07 DIAGNOSIS — Z01812 Encounter for preprocedural laboratory examination: Secondary | ICD-10-CM | POA: Diagnosis present

## 2019-08-08 ENCOUNTER — Encounter: Payer: Self-pay | Admitting: *Deleted

## 2019-08-08 LAB — SARS CORONAVIRUS 2 (TAT 6-24 HRS): SARS Coronavirus 2: NEGATIVE

## 2019-08-09 ENCOUNTER — Ambulatory Visit: Payer: 59 | Admitting: Certified Registered"

## 2019-08-09 ENCOUNTER — Encounter: Payer: Self-pay | Admitting: *Deleted

## 2019-08-09 ENCOUNTER — Encounter
Admission: RE | Disposition: A | Discharge status: Home / Self Care | Payer: Self-pay | Source: Home / Self Care | Attending: Gastroenterology

## 2019-08-09 ENCOUNTER — Ambulatory Visit
Admission: RE | Admit: 2019-08-09 | Discharge: 2019-08-09 | Disposition: A | Discharge status: Home / Self Care | Payer: 59 | Attending: Gastroenterology | Admitting: Gastroenterology

## 2019-08-09 DIAGNOSIS — I129 Hypertensive chronic kidney disease with stage 1 through stage 4 chronic kidney disease, or unspecified chronic kidney disease: Secondary | ICD-10-CM | POA: Insufficient documentation

## 2019-08-09 DIAGNOSIS — Z7982 Long term (current) use of aspirin: Secondary | ICD-10-CM | POA: Diagnosis not present

## 2019-08-09 DIAGNOSIS — E559 Vitamin D deficiency, unspecified: Secondary | ICD-10-CM | POA: Insufficient documentation

## 2019-08-09 DIAGNOSIS — B1910 Unspecified viral hepatitis B without hepatic coma: Secondary | ICD-10-CM | POA: Insufficient documentation

## 2019-08-09 DIAGNOSIS — K219 Gastro-esophageal reflux disease without esophagitis: Secondary | ICD-10-CM | POA: Diagnosis not present

## 2019-08-09 DIAGNOSIS — K648 Other hemorrhoids: Secondary | ICD-10-CM | POA: Diagnosis not present

## 2019-08-09 DIAGNOSIS — Z79899 Other long term (current) drug therapy: Secondary | ICD-10-CM | POA: Insufficient documentation

## 2019-08-09 DIAGNOSIS — E7800 Pure hypercholesterolemia, unspecified: Secondary | ICD-10-CM | POA: Insufficient documentation

## 2019-08-09 DIAGNOSIS — Z87891 Personal history of nicotine dependence: Secondary | ICD-10-CM | POA: Diagnosis not present

## 2019-08-09 DIAGNOSIS — K5790 Diverticulosis of intestine, part unspecified, without perforation or abscess without bleeding: Secondary | ICD-10-CM | POA: Insufficient documentation

## 2019-08-09 DIAGNOSIS — Z7901 Long term (current) use of anticoagulants: Secondary | ICD-10-CM | POA: Insufficient documentation

## 2019-08-09 DIAGNOSIS — K635 Polyp of colon: Secondary | ICD-10-CM | POA: Diagnosis not present

## 2019-08-09 DIAGNOSIS — F419 Anxiety disorder, unspecified: Secondary | ICD-10-CM | POA: Diagnosis not present

## 2019-08-09 DIAGNOSIS — Z1211 Encounter for screening for malignant neoplasm of colon: Secondary | ICD-10-CM | POA: Insufficient documentation

## 2019-08-09 DIAGNOSIS — I252 Old myocardial infarction: Secondary | ICD-10-CM | POA: Insufficient documentation

## 2019-08-09 DIAGNOSIS — N183 Chronic kidney disease, stage 3 unspecified: Secondary | ICD-10-CM | POA: Insufficient documentation

## 2019-08-09 DIAGNOSIS — I2510 Atherosclerotic heart disease of native coronary artery without angina pectoris: Secondary | ICD-10-CM | POA: Diagnosis not present

## 2019-08-09 HISTORY — PX: COLONOSCOPY WITH PROPOFOL: SHX5780

## 2019-08-09 HISTORY — DX: Pure hypercholesterolemia, unspecified: E78.00

## 2019-08-09 HISTORY — DX: Diverticulosis of intestine, part unspecified, without perforation or abscess without bleeding: K57.90

## 2019-08-09 HISTORY — DX: Malignant (primary) neoplasm, unspecified: C80.1

## 2019-08-09 HISTORY — DX: Aneurysm of artery of upper extremity: I72.1

## 2019-08-09 HISTORY — DX: Vitamin D deficiency, unspecified: E55.9

## 2019-08-09 SURGERY — COLONOSCOPY WITH PROPOFOL
Anesthesia: General

## 2019-08-09 MED ORDER — PROPOFOL 500 MG/50ML IV EMUL
INTRAVENOUS | Status: AC
  Filled 2019-08-09: qty 50

## 2019-08-09 MED ORDER — LIDOCAINE HCL (PF) 2 % IJ SOLN
INTRAMUSCULAR | Status: AC
  Filled 2019-08-09: qty 5

## 2019-08-09 MED ORDER — PROPOFOL 500 MG/50ML IV EMUL
INTRAVENOUS | Status: DC | PRN
  Administered 2019-08-09: 99 ug/kg/min via INTRAVENOUS

## 2019-08-09 MED ORDER — HYDRALAZINE HCL 20 MG/ML IJ SOLN
INTRAMUSCULAR | Status: AC
  Filled 2019-08-09: qty 1

## 2019-08-09 MED ORDER — SODIUM CHLORIDE 0.9 % IV SOLN
INTRAVENOUS | Status: DC
  Administered 2019-08-09: 1000 mL via INTRAVENOUS

## 2019-08-09 MED ORDER — PROPOFOL 10 MG/ML IV BOLUS
INTRAVENOUS | Status: AC
  Filled 2019-08-09: qty 20

## 2019-08-09 NOTE — Anesthesia Postprocedure Evaluation (Signed)
Anesthesia Post Note  Patient: Eugine Bubb  Procedure(s) Performed: COLONOSCOPY WITH PROPOFOL (N/A )  Patient location during evaluation: Endoscopy Anesthesia Type: General Level of consciousness: awake and alert Pain management: pain level controlled Vital Signs Assessment: post-procedure vital signs reviewed and stable Respiratory status: spontaneous breathing, nonlabored ventilation, respiratory function stable and patient connected to nasal cannula oxygen Cardiovascular status: blood pressure returned to baseline and stable Postop Assessment: no apparent nausea or vomiting Anesthetic complications: no   No complications documented.   Last Vitals:  Vitals:   08/09/19 0947 08/09/19 0957  BP: (!) 153/97 (!) 144/101  Pulse: 49 51  Resp: (!) 10 15  Temp:    SpO2: 100% 100%    Last Pain:  Vitals:   08/09/19 0947  TempSrc:   PainSc: 0-No pain                 Martha Clan
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
## 2019-08-09 NOTE — Transfer of Care (Signed)
Immediate Anesthesia Transfer of Care Note  Patient: Keilyn Nadal  Procedure(s) Performed: COLONOSCOPY WITH PROPOFOL (N/A )  Patient Location: PACU and Endoscopy Unit  Anesthesia Type:General  Level of Consciousness: sedated and patient cooperative  Airway & Oxygen Therapy: Patient Spontanous Breathing and Patient connected to face mask oxygen  Post-op Assessment: Report given to RN and Post -op Vital signs reviewed and stable  Post vital signs: Reviewed and stable  Last Vitals:  Vitals Value Taken Time  BP    Temp    Pulse 55 08/09/19 0937  Resp 14 08/09/19 0937  SpO2 100 % 08/09/19 0937  Vitals shown include unvalidated device data.  Last Pain:  Vitals:   08/09/19 0821  TempSrc: Temporal  PainSc: 0-No pain         Complications: No complications documented.
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
## 2019-08-09 NOTE — Op Note (Signed)
[facility] Gastroenterology Patient Name: Daniel Jackson Procedure Date: 08/09/2019 8:44 AM MRN: [id number] Account #: [id number] Date of Birth: August 04, 1955 Admit Type: Outpatient Age: 64 Room: [facility] ENDO ROOM 3 Gender: Male Note Status: Finalized Procedure:             Colonoscopy Indications:           Screening for colorectal malignant neoplasm Providers:             Andrey Farmer MD, MD Medicines:             Monitored Anesthesia Care Complications:         No immediate complications. Estimated blood loss:                         Minimal. Procedure:             Pre-Anesthesia Assessment:                        - Prior to the procedure, a History and Physical was                         performed, and patient medications and allergies were                         reviewed. The patient is competent. The risks and                         benefits of the procedure and the sedation options and                         risks were discussed with the patient. All questions                         were answered and informed consent was obtained.                         Patient identification and proposed procedure were                         verified by the physician, the nurse, the anesthetist                         and the technician in the endoscopy suite. Mental                         Status Examination: alert and oriented. Airway                         Examination: normal oropharyngeal airway and neck                         mobility. Respiratory Examination: clear to                         auscultation. CV Examination: normal. Prophylactic                         Antibiotics: The patient does not require prophylactic  antibiotics. Prior Anticoagulants: The patient has                         taken Xarelto (rivaroxaban), last dose was 2 days                         prior to procedure. ASA Grade Assessment: III - A                          patient with severe systemic disease. After reviewing                         the risks and benefits, the patient was deemed in                         satisfactory condition to undergo the procedure. The                         anesthesia plan was to use monitored anesthesia care                         (MAC). Immediately prior to administration of                         medications, the patient was re-assessed for adequacy                         to receive sedatives. The heart rate, respiratory                         rate, oxygen saturations, blood pressure, adequacy of                         pulmonary ventilation, and response to care were                         monitored throughout the procedure. The physical                         status of the patient was re-assessed after the                         procedure.                        After obtaining informed consent, the colonoscope was                         passed under direct vision. Throughout the procedure,                         the patient's blood pressure, pulse, and oxygen                         saturations were monitored continuously. The                         Colonoscope was introduced through the anus and  advanced to the the cecum, identified by appendiceal                         orifice and ileocecal valve. The colonoscopy was                         technically difficult and complex due to significant                         looping. Successful completion of the procedure was                         aided by applying abdominal pressure. The patient                         tolerated the procedure well. The quality of the bowel                         preparation was adequate to identify polyps. Findings:      The perianal and digital rectal examinations were normal.      A 2 mm polyp was found in the transverse colon. The polyp was sessile.       The polyp was removed with a jumbo  cold forceps. Appeared benign.       Resection and retrieval were complete. Estimated blood loss was minimal.      Internal hemorrhoids were found during retroflexion. The hemorrhoids       were small.      The exam was otherwise without abnormality on direct and retroflexion       views. Impression:            - One 2 mm polyp in the transverse colon, removed with                         a jumbo cold forceps. Resected and retrieved.                        - Internal hemorrhoids.                        - The examination was otherwise normal on direct and                         retroflexion views. Recommendation:        - Discharge patient to home.                        - Resume previous diet.                        - Continue present medications.                        - Await pathology results.                        - Repeat colonoscopy in 10 years for surveillance.                        - Return to referring physician as previously  scheduled. Procedure Code(s):     --- Professional ---                        [phone], Colonoscopy, flexible; with biopsy, single or                         multiple Diagnosis Code(s):     --- Professional ---                        Z12.11, Encounter for screening for malignant neoplasm                         of colon                        K63.5, Polyp of colon                        K64.8, Other hemorrhoids CPT copyright 2019 American Medical Association. All rights reserved. The codes documented in this report are preliminary and upon coder review may  be revised to meet current compliance requirements. Andrey Farmer, MD Andrey Farmer MD, MD 08/09/2019 9:35:29 AM Number of Addenda: 0 Note Initiated On: 08/09/2019 8:44 AM Scope Withdrawal Time: 0 hours 13 minutes 47 seconds  Total Procedure Duration: 0 hours 28 minutes 23 seconds  Estimated Blood Loss:  Estimated blood loss was minimal.      [facility]

## 2019-08-09 NOTE — Interval H&P Note (Signed)
History and Physical Interval Note:  08/09/2019 8:55 AM  Daniel Jackson  has presented today for surgery, with the diagnosis of [place].  The various methods of treatment have been discussed with the patient and family. After consideration of risks, benefits and other options for treatment, the patient has consented to  Procedure(s): COLONOSCOPY WITH PROPOFOL (N/A) as a surgical intervention.  The patient's history has been reviewed, patient examined, no change in status, stable for surgery.  I have reviewed the patient's chart and labs.  Questions were answered to the patient's satisfaction.     Lesly Rubenstein  Ok to proceed with colonoscopy

## 2019-08-12 ENCOUNTER — Encounter: Payer: Self-pay | Admitting: Gastroenterology

## 2019-08-12 LAB — SURGICAL PATHOLOGY

## 2019-09-11 ENCOUNTER — Encounter: Payer: Self-pay | Admitting: Urology

## 2019-09-11 ENCOUNTER — Ambulatory Visit: Payer: 59 | Admitting: Urology

## 2021-09-27 ENCOUNTER — Emergency Department: Payer: 59

## 2021-09-27 ENCOUNTER — Emergency Department
Admission: EM | Admit: 2021-09-27 | Discharge: 2021-09-27 | Disposition: A | Discharge status: Home / Self Care | Payer: 59 | Attending: Emergency Medicine | Admitting: Emergency Medicine

## 2021-09-27 DIAGNOSIS — R531 Weakness: Secondary | ICD-10-CM | POA: Diagnosis present

## 2021-09-27 DIAGNOSIS — I2510 Atherosclerotic heart disease of native coronary artery without angina pectoris: Secondary | ICD-10-CM | POA: Diagnosis not present

## 2021-09-27 DIAGNOSIS — I129 Hypertensive chronic kidney disease with stage 1 through stage 4 chronic kidney disease, or unspecified chronic kidney disease: Secondary | ICD-10-CM | POA: Diagnosis not present

## 2021-09-27 DIAGNOSIS — I4891 Unspecified atrial fibrillation: Secondary | ICD-10-CM

## 2021-09-27 DIAGNOSIS — R61 Generalized hyperhidrosis: Secondary | ICD-10-CM | POA: Diagnosis not present

## 2021-09-27 DIAGNOSIS — N189 Chronic kidney disease, unspecified: Secondary | ICD-10-CM | POA: Diagnosis not present

## 2021-09-27 LAB — BASIC METABOLIC PANEL
Anion gap: 9 (ref 5–15)
BUN: 26 mg/dL — ABNORMAL HIGH (ref 8–23)
CO2: 22 mmol/L (ref 22–32)
Calcium: 9.3 mg/dL (ref 8.9–10.3)
Chloride: 107 mmol/L (ref 98–111)
Creatinine, Ser: 1.75 mg/dL — ABNORMAL HIGH (ref 0.61–1.24)
GFR, Estimated: 42 mL/min — ABNORMAL LOW (ref >60)
Glucose, Bld: 101 mg/dL — ABNORMAL HIGH (ref 70–99)
Potassium: 4.3 mmol/L (ref 3.5–5.1)
Sodium: 138 mmol/L (ref 135–145)

## 2021-09-27 LAB — CBC
HCT: 47 % (ref 39.0–52.0)
Hemoglobin: 15.5 g/dL (ref 13.0–17.0)
MCH: 29.5 pg (ref 26.0–34.0)
MCHC: 33 g/dL (ref 30.0–36.0)
MCV: 89.5 fL (ref 80.0–100.0)
Platelets: 247 10*3/uL (ref 150–400)
RBC: 5.25 MIL/uL (ref 4.22–5.81)
RDW: 12.7 % (ref 11.5–15.5)
WBC: 3.8 10*3/uL — ABNORMAL LOW (ref 4.0–10.5)
nRBC: 0 % (ref 0.0–0.2)

## 2021-09-27 LAB — TROPONIN I (HIGH SENSITIVITY)
Troponin I (High Sensitivity): 4 ng/L (ref <18)
Troponin I (High Sensitivity): 4 ng/L (ref <18)

## 2021-09-27 NOTE — ED Triage Notes (Signed)
Pt arrives with c/o sweating/diaphoretic episode while at work. Pt endorse sluggish/weakness feeling. Per pt, he was sitting down at work and the episode started and lasted about an hour. Pt denies SOB or CP. Per pt, the last time he had these symptoms he had a MI.

## 2021-09-27 NOTE — ED Notes (Signed)
Patient transported to X-ray 

## 2021-09-27 NOTE — ED Provider Notes (Signed)
[facility] Provider Note    Event Date/Time   First MD Initiated Contact with Patient 09/27/21 1122     (approximate)   History   Chief Complaint: Weakness   HPI  Daniel Jackson is a 66 y.o. male with a history of hypertension, atrial fibrillation, TBI, CAD who comes the ED complaining of an episode of sweating while at work.  No chest pain or shortness of breath but did feel fatigued.  Lasted about an hour.  Not exertional.  No dizziness or syncope.  He has a history of MI and is worried that he may be having another heart attack.  No recent exertional symptoms.     Physical Exam   Triage Vital Signs: ED Triage Vitals [09/27/21 1055]  Enc Vitals Group     BP (!) 148/95     Pulse Rate 83     Resp 16     Temp 98.3 F (36.8 C)     Temp src      SpO2 95 %     Weight      Height      Head Circumference      Peak Flow      Pain Score      Pain Loc      Pain Edu?      Excl. in [place]?     Most recent vital signs: Vitals:   09/27/21 1430 09/27/21 1500  BP: (!) 146/96 (!) 138/115  Pulse: 85 69  Resp: 14 15  Temp:    SpO2: 95% 98%    General: Awake, no distress.  CV:  Good peripheral perfusion.  Irregularly irregular rhythm.  Good peripheral pulses Resp:  Normal effort.  Clear to auscultation bilaterally Abd:  No distention.  Soft and nontender Other:  Moist oral mucosa   ED Results / Procedures / Treatments   Labs (all labs ordered are listed, but only abnormal results are displayed) Labs Reviewed  BASIC METABOLIC PANEL - Abnormal; Notable for the following components:      Result Value   Glucose, Bld 101 (*)    BUN 26 (*)    Creatinine, Ser 1.75 (*)    GFR, Estimated 42 (*)    All other components within normal limits  CBC - Abnormal; Notable for the following components:   WBC 3.8 (*)    All other components within normal limits  TROPONIN I (HIGH SENSITIVITY)  TROPONIN I (HIGH SENSITIVITY)     EKG Interpreted by  me Atrial fibrillation, rate of 113.  Normal axis, normal intervals.  Poor R wave progression.  Normal ST segments and T waves   RADIOLOGY Chest x-ray interpreted by me, appears normal.  Radiology report reviewed   PROCEDURES:  Procedures   MEDICATIONS ORDERED IN ED: Medications - No data to display   IMPRESSION / MDM / [place] / ED COURSE  I reviewed the triage vital signs and the nursing notes.                              Differential diagnosis includes, but is not limited to, non-STEMI, anemia, dehydration, electrolyte abnormality, AKI, pneumonia  Patient's presentation is most consistent with acute presentation with potential threat to life or bodily function.  Patient presents with an episode of sweating and fatigue.  Found to be in atrial fibrillation which may be paroxysmal and the cause of his symptoms.  He is already  on apixaban.  Vital signs are otherwise unremarkable, EKG shows A-fib but he has been rate controlled with a heart rate in the 80s throughout his ED work-up.  Creatinine is at baseline CKD.  Other labs unremarkable including serial troponins.  Not requiring hospitalization.       FINAL CLINICAL IMPRESSION(S) / ED DIAGNOSES   Final diagnoses:  Generalized weakness     Rx / DC Orders   ED Discharge Orders     None        Note:  This document was prepared using Dragon voice recognition software and may include unintentional dictation errors.   Carrie Mew, MD 09/27/21 [id number]
